# Patient Record
Sex: Female | Born: 1970 | Race: White | Hispanic: No | Marital: Married | Smoking: Never smoker
Health system: Southern US, Community
[De-identification: ages and names within clinical notes are randomized; demographics above are authoritative.]

## PROBLEM LIST (undated history)

## (undated) DIAGNOSIS — I8390 Asymptomatic varicose veins of unspecified lower extremity: Secondary | ICD-10-CM

## (undated) DIAGNOSIS — I7300 Raynaud's syndrome without gangrene: Secondary | ICD-10-CM

## (undated) HISTORY — DX: Raynaud's syndrome without gangrene: I73.00

## (undated) HISTORY — DX: Asymptomatic varicose veins of unspecified lower extremity: I83.90

---

## 1994-02-03 HISTORY — PX: PATELLAR TENDON REPAIR: SHX737

## 1994-02-03 HISTORY — PX: BREAST LUMPECTOMY: SHX2

## 1994-02-03 HISTORY — PX: BREAST EXCISIONAL BIOPSY: SUR124

## 1997-03-21 ENCOUNTER — Other Ambulatory Visit: Admission: RE | Admit: 1997-03-21 | Discharge: 1997-03-21 | Payer: Self-pay | Admitting: Unknown Physician Specialty

## 1997-06-12 ENCOUNTER — Other Ambulatory Visit: Admission: RE | Admit: 1997-06-12 | Discharge: 1997-06-12 | Payer: Self-pay | Admitting: Obstetrics and Gynecology

## 1998-01-16 ENCOUNTER — Other Ambulatory Visit: Admission: RE | Admit: 1998-01-16 | Discharge: 1998-01-16 | Payer: Self-pay | Admitting: Obstetrics and Gynecology

## 1998-06-28 ENCOUNTER — Other Ambulatory Visit: Admission: RE | Admit: 1998-06-28 | Discharge: 1998-06-28 | Payer: Self-pay | Admitting: Obstetrics and Gynecology

## 1998-12-06 ENCOUNTER — Other Ambulatory Visit: Admission: RE | Admit: 1998-12-06 | Discharge: 1998-12-06 | Payer: Self-pay | Admitting: Obstetrics and Gynecology

## 1999-07-10 ENCOUNTER — Other Ambulatory Visit: Admission: RE | Admit: 1999-07-10 | Discharge: 1999-07-10 | Payer: Self-pay | Admitting: Obstetrics and Gynecology

## 2000-07-23 ENCOUNTER — Inpatient Hospital Stay (HOSPITAL_COMMUNITY): Admission: AD | Admit: 2000-07-23 | Discharge: 2000-07-25 | Payer: Self-pay | Admitting: Obstetrics & Gynecology

## 2000-07-28 ENCOUNTER — Encounter: Admission: RE | Admit: 2000-07-28 | Discharge: 2000-08-27 | Payer: Self-pay | Admitting: Obstetrics and Gynecology

## 2000-09-07 ENCOUNTER — Other Ambulatory Visit: Admission: RE | Admit: 2000-09-07 | Discharge: 2000-09-07 | Payer: Self-pay | Admitting: Obstetrics and Gynecology

## 2001-10-07 ENCOUNTER — Other Ambulatory Visit: Admission: RE | Admit: 2001-10-07 | Discharge: 2001-10-07 | Payer: Self-pay | Admitting: Obstetrics and Gynecology

## 2002-07-26 ENCOUNTER — Other Ambulatory Visit: Admission: RE | Admit: 2002-07-26 | Discharge: 2002-07-26 | Payer: Self-pay | Admitting: Obstetrics and Gynecology

## 2002-12-17 ENCOUNTER — Inpatient Hospital Stay (HOSPITAL_COMMUNITY): Admission: AD | Admit: 2002-12-17 | Discharge: 2002-12-17 | Payer: Self-pay | Admitting: Obstetrics & Gynecology

## 2003-02-19 ENCOUNTER — Inpatient Hospital Stay (HOSPITAL_COMMUNITY): Admission: AD | Admit: 2003-02-19 | Discharge: 2003-02-21 | Payer: Self-pay | Admitting: *Deleted

## 2003-04-03 ENCOUNTER — Other Ambulatory Visit: Admission: RE | Admit: 2003-04-03 | Discharge: 2003-04-03 | Payer: Self-pay | Admitting: Obstetrics and Gynecology

## 2004-05-08 ENCOUNTER — Other Ambulatory Visit: Admission: RE | Admit: 2004-05-08 | Discharge: 2004-05-08 | Payer: Self-pay | Admitting: Obstetrics and Gynecology

## 2004-05-10 ENCOUNTER — Encounter: Admission: RE | Admit: 2004-05-10 | Discharge: 2004-05-10 | Payer: Self-pay | Admitting: Obstetrics and Gynecology

## 2006-07-10 ENCOUNTER — Ambulatory Visit (HOSPITAL_COMMUNITY): Admission: RE | Admit: 2006-07-10 | Discharge: 2006-07-10 | Payer: Self-pay | Admitting: Obstetrics and Gynecology

## 2006-10-06 ENCOUNTER — Ambulatory Visit: Payer: Self-pay | Admitting: Obstetrics and Gynecology

## 2006-10-13 ENCOUNTER — Ambulatory Visit: Payer: Self-pay | Admitting: Obstetrics & Gynecology

## 2006-10-20 ENCOUNTER — Ambulatory Visit: Payer: Self-pay | Admitting: Gynecology

## 2006-10-28 ENCOUNTER — Ambulatory Visit: Payer: Self-pay | Admitting: Obstetrics & Gynecology

## 2006-11-03 ENCOUNTER — Ambulatory Visit: Payer: Self-pay | Admitting: Family Medicine

## 2006-11-10 ENCOUNTER — Ambulatory Visit: Payer: Self-pay | Admitting: Obstetrics & Gynecology

## 2006-11-17 ENCOUNTER — Ambulatory Visit: Payer: Self-pay | Admitting: Obstetrics & Gynecology

## 2006-11-22 ENCOUNTER — Encounter: Admission: RE | Admit: 2006-11-22 | Discharge: 2006-12-22 | Payer: Self-pay | Admitting: Obstetrics and Gynecology

## 2006-11-23 ENCOUNTER — Ambulatory Visit: Admission: RE | Admit: 2006-11-23 | Discharge: 2006-11-23 | Payer: Self-pay | Admitting: Obstetrics and Gynecology

## 2006-11-26 ENCOUNTER — Ambulatory Visit: Admission: RE | Admit: 2006-11-26 | Discharge: 2006-11-26 | Payer: Self-pay | Admitting: Obstetrics and Gynecology

## 2006-12-23 ENCOUNTER — Encounter: Admission: RE | Admit: 2006-12-23 | Discharge: 2007-01-21 | Payer: Self-pay | Admitting: Obstetrics and Gynecology

## 2007-01-22 ENCOUNTER — Encounter: Admission: RE | Admit: 2007-01-22 | Discharge: 2007-02-21 | Payer: Self-pay | Admitting: Obstetrics and Gynecology

## 2007-02-22 ENCOUNTER — Encounter: Admission: RE | Admit: 2007-02-22 | Discharge: 2007-03-24 | Payer: Self-pay | Admitting: Obstetrics and Gynecology

## 2007-03-25 ENCOUNTER — Encounter: Admission: RE | Admit: 2007-03-25 | Discharge: 2007-04-21 | Payer: Self-pay | Admitting: Obstetrics and Gynecology

## 2007-04-22 ENCOUNTER — Encounter: Admission: RE | Admit: 2007-04-22 | Discharge: 2007-05-22 | Payer: Self-pay | Admitting: Obstetrics and Gynecology

## 2007-05-23 ENCOUNTER — Encounter: Admission: RE | Admit: 2007-05-23 | Discharge: 2007-06-21 | Payer: Self-pay | Admitting: Obstetrics and Gynecology

## 2007-06-22 ENCOUNTER — Encounter: Admission: RE | Admit: 2007-06-22 | Discharge: 2007-07-22 | Payer: Self-pay | Admitting: Obstetrics and Gynecology

## 2007-10-01 ENCOUNTER — Ambulatory Visit: Payer: Self-pay | Admitting: Vascular Surgery

## 2008-01-07 ENCOUNTER — Ambulatory Visit: Payer: Self-pay | Admitting: Vascular Surgery

## 2008-03-29 ENCOUNTER — Ambulatory Visit: Payer: Self-pay | Admitting: Vascular Surgery

## 2008-03-29 HISTORY — PX: ENDOVENOUS ABLATION SAPHENOUS VEIN W/ LASER: SUR449

## 2008-04-05 ENCOUNTER — Ambulatory Visit: Payer: Self-pay | Admitting: Vascular Surgery

## 2008-06-16 ENCOUNTER — Ambulatory Visit: Payer: Self-pay | Admitting: Vascular Surgery

## 2008-10-16 ENCOUNTER — Encounter: Admission: RE | Admit: 2008-10-16 | Discharge: 2008-10-16 | Payer: Self-pay | Admitting: Internal Medicine

## 2009-11-01 ENCOUNTER — Encounter: Admission: RE | Admit: 2009-11-01 | Discharge: 2009-11-01 | Payer: Self-pay | Admitting: Obstetrics and Gynecology

## 2009-11-08 ENCOUNTER — Encounter: Admission: RE | Admit: 2009-11-08 | Discharge: 2009-11-08 | Payer: Self-pay | Admitting: Obstetrics and Gynecology

## 2010-06-18 NOTE — Assessment & Plan Note (Signed)
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  She looks quite good 1 week out.  She has no  significant discomfort in her calf.  She does report a typical amount of  discomfort and soreness in her medial thigh.  She does have some mild  typical bruising as well.   She underwent repeat venous duplex today, and this reveals complete  closure of her saphenous vein from her knee to her groin and no evidence  of DVT.   I am quite pleased with her initial result, as is the patient.  Plan to  see her again in 6 weeks for final followup.   Larina Earthly, M.D.  Electronically Signed   TFE/MEDQ  D:  04/05/2008  T:  04/06/2008  Job:  [id number]   cc:   Amy Y. [place], M.D.

## 2010-06-18 NOTE — Procedures (Signed)
LOWER EXTREMITY VENOUS REFLUX EXAM   INDICATION:  Right lower extremity painful varicosities.   EXAM:  Using color-flow imaging and pulse Doppler spectral analysis, the  right common femoral, superficial femoral, popliteal, posterior tibial,  greater and lesser saphenous veins are evaluated.  There is evidence  suggesting deep venous insufficiency in the right lower extremity common  femoral vein.   The right saphenofemoral junction is not competent.  The right GSV is  not competent with the caliber as described below.   The right proximal short saphenous vein demonstrates competency and is  barely visualized.   GSV Diameter (used if found to be incompetent only)                                            Right    Left  Proximal Greater Saphenous Vein           0.95 cm  cm  Proximal-to-mid-thigh                     1.44 cm  cm  Mid thigh                                 0.68 cm  cm  Mid-distal thigh                          1.35 cm  cm  Distal thigh                              0.83 cm  cm  Knee                                      0.63 cm  cm   IMPRESSION:  1. Right greater saphenous vein reflux is identified with the caliber      ranging from 0.63 cm to 1.44 cm knee to groin.  2. The right greater saphenous vein is not aneurysmal.  3. The right greater saphenous vein is not tortuous.  4. The deep venous system is not competent in the right common femoral      vein.  5. The right lesser saphenous vein is competent; however, barely      visualized.   ___________________________________________  Larina Earthly, M.D.   AS/MEDQ  D:  01/07/2008  T:  01/07/2008  Job:  [id number]

## 2010-06-18 NOTE — Procedures (Signed)
DUPLEX DEEP VENOUS EXAM - LOWER EXTREMITY   INDICATION:  Follow up right greater saphenous vein ablation.   HISTORY:  Edema:  Minimal, at top of thigh.  Trauma/Surgery:  Right greater saphenous vein ablation with stab  phlebectomy on 03/29/08 by Dr. Arbie Cookey.  Pain:  No.  PE:  No.  Previous DVT:  No.  Anticoagulants:  No.  Other:   DUPLEX EXAM:                CFV   SFV   PopV   PTV   GSV                R  L  R  L  R  L   R  L  R  L  Thrombosis    o  o  o     o      o     +  Spontaneous   +  +  +     +      +     0  Phasic        +  +  +     +      +     0  Augmentation  +  +  +     +      +     0  Compressible  +  +  +     +      +     0  Competent     D  D  +     +      +     0   Legend:  + - yes  o - no  p - partial  D - decreased   IMPRESSION:  1. No evidence of deep venous thrombosis in the right lower extremity      or the left common femoral vein.  2. Evidence of ablation of the right greater saphenous vein with no      flow noted.  3. Evidence of dilatated segments of greater saphenous vein in the      proximal and distal thigh, consistent with previous study.  4. Bilateral common femoral veins show evidence of mild insufficiency.  5. Right saphenofemoral junction shows evidence of minimal      insufficiency flow into the greater saphenous vein lateral      accessory branch.    _____________________________  Larina Earthly, M.D.   AS/MEDQ  D:  04/05/2008  T:  04/05/2008  Job:  [id number]

## 2010-06-18 NOTE — Assessment & Plan Note (Signed)
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  She has  worn compression garments for 3 months since my last visit with her,  thigh-high 20-30 mmHg.  She reports that his has given her no relief in  her pain.  She has difficulty with childcare, mother of 4 young  children, and housework due to leg pain and swelling.  She also has had  to decrease the frequency and duration of types of exercise.  She  stopped aerobics and high impact exercises due to pain and swelling in  her legs.  Also has great difficulty with prolonged sitting, for  example, sewing, due to leg pain and swelling.   PHYSICAL EXAM:  She continues to have marked varicosities throughout her  right calf.   She underwent a formal venous duplex today and this confirmed my initial  impression with her hand-held screening duplex, that shows marked reflux  throughout her great saphenous vein from the saphenofemoral junction  down into the calf.  I discussed the significance of this with the  patient.  I recommended that we proceed with laser ablation of her right  greater saphenous vein and concomitant stab phlebectomy of her multiple  tributary varicosities.  I explained the procedure is an outpatient  procedure that would take approximately 1-1/2 hours in our office.  I  explained the potential complications to include deep vein thrombosis,  very unusual complication, and also potential for non-closure of her  saphenous vein.  She understands and wishes to proceed as soon as we can  assure insurance coverage.   Larina Earthly, M.D.  Electronically Signed   TFE/MEDQ  D:  01/07/2008  T:  01/10/2008  Job:  [id number]   cc:   Amy Y. [place], M.D.

## 2010-06-18 NOTE — Assessment & Plan Note (Signed)
OFFICE VISIT   [name], [name]  DOB:  04-29-1970                                       06/16/2008  [id number]#:[id number]   Patient presents today for followup of her laser ablation of her reverse  greater saphenous vein and stab phlebectomy of her tributary  varicosities on 03/29/08.  She has done well since the procedure.  She  does report that the discomfort related to the varicosities has  completely resolved.  She still reports some sensation of heat with  prolonged standing in her right calf.   PHYSICAL EXAMINATION:  The marked varicosities have been treated with  stab phlebectomy, and the incisions are all healing quite nicely.  She  does have no significant swelling.  She has multiple bilateral spider  vein telangiectasia on both thighs and calves.   She underwent hand-held imaging of her saphenous vein on the right which  shows closure of her saphenous vein throughout its course.  She does  have patency of the short saphenous vein with some tributary varices in  the posterior calf coming off of the small saphenous.  She is not having  any symptoms related to this, and I have recommended continued  observation.  She was interested in potentially pursuing sclerotherapy  for cosmetic concerns in the fall and will notify us should she wish to  proceed.   Larina Earthly, M.D.  Electronically Signed   TFE/MEDQ  D:  06/16/2008  T:  06/19/2008  Job:  [id number]

## 2010-06-18 NOTE — Consult Note (Signed)
NEW PATIENT CONSULTATION   [name], [name]  DOB:  08-Jul-1970                                       10/01/2007  ZOXWR#:[id number]   The patient presents today for evaluation of progressive venous  hypertension in her right leg.  She is a very pleasant healthy 36-year  white female who has had progressive difficulty with the right leg  varicose veins.  These began during the initial pregnancies and have  become more severe with each additional pregnancy.  On her most recent  pregnancy, she did wear graduated compression pantyhose throughout her  pregnancy and continues to wear pantyhose style graduated compression  stockings.  Despite this, she has had progressive pain, and discomfort  related.  She reports a tired heavy sensation over the right leg in  particular over the area of the varicosities.  She has pain in her legs  with exercise with a throbbing and cramping discomfort and burning pain  with skin discoloration over the areas.  She does report swelling in her  calves and ankles.  She does have a family history of the varicosities  in her mother.   PAST MEDICAL HISTORY:  Significant only for migraine headaches and  asthma.   MEDICAL ALLERGIES:  Sulfa.   She does not use tobacco products and does not have any history of deep  venous thrombosis.   PAST SURGICAL HISTORY:  Knee surgery and lumpectomy of her left breast.   PHYSICAL EXAMINATION:  Well-developed, well-nourished white female  appearing stated age of 72.  Her blood 105/70, pulse 75, respirations  18.  Her left leg has scattered telangiectasias and no varicosities.  On  her right leg, she does have very large marked varicosities in her right  posterior calf.  She does have marked telangiectasia over her right leg  from her knees distally.   She underwent a screening duplex by me and this reveals an enlarged  great saphenous vein on the right with gross reflux.  Saphenous vein on  the left  appears to be normal.  I discussed options with the patient.  She has clearly failed conservative treatment with elevation and  complaint use of pantyhose style graduated compression garments.  I have  recommended right leg laser ablation of her great saphenous vein and  stab phlebectomy for relief of symptoms.  She will undergo formal duplex  to confirm that she does not have any significant of deep venous  pathology.  We will schedule her procedure following her duplex  evaluation.   Deborah [name], M.D.  Electronically Signed   TFE/MEDQ  D:  10/01/2007  T:  10/04/2007  Job:  [id number]   cc:   Amy Y. [place], M.D.

## 2010-11-14 ENCOUNTER — Other Ambulatory Visit: Payer: Self-pay | Admitting: Obstetrics and Gynecology

## 2010-11-14 DIAGNOSIS — Z1231 Encounter for screening mammogram for malignant neoplasm of breast: Secondary | ICD-10-CM

## 2010-12-04 ENCOUNTER — Ambulatory Visit
Admission: RE | Admit: 2010-12-04 | Discharge: 2010-12-04 | Disposition: A | Discharge status: Home / Self Care | Payer: BC Managed Care – PPO | Source: Ambulatory Visit | Attending: Obstetrics and Gynecology | Admitting: Obstetrics and Gynecology

## 2010-12-04 DIAGNOSIS — Z1231 Encounter for screening mammogram for malignant neoplasm of breast: Secondary | ICD-10-CM

## 2010-12-11 ENCOUNTER — Other Ambulatory Visit: Payer: Self-pay | Admitting: Obstetrics and Gynecology

## 2010-12-11 DIAGNOSIS — R928 Other abnormal and inconclusive findings on diagnostic imaging of breast: Secondary | ICD-10-CM

## 2010-12-18 ENCOUNTER — Ambulatory Visit
Admission: RE | Admit: 2010-12-18 | Discharge: 2010-12-18 | Disposition: A | Discharge status: Home / Self Care | Payer: BC Managed Care – PPO | Source: Ambulatory Visit | Attending: Obstetrics and Gynecology | Admitting: Obstetrics and Gynecology

## 2010-12-18 DIAGNOSIS — R928 Other abnormal and inconclusive findings on diagnostic imaging of breast: Secondary | ICD-10-CM

## 2010-12-30 ENCOUNTER — Other Ambulatory Visit: Payer: BC Managed Care – PPO

## 2011-08-18 ENCOUNTER — Encounter: Payer: Self-pay | Admitting: Vascular Surgery

## 2011-08-19 ENCOUNTER — Ambulatory Visit (INDEPENDENT_AMBULATORY_CARE_PROVIDER_SITE_OTHER): Payer: BC Managed Care – PPO | Admitting: Vascular Surgery

## 2011-08-19 ENCOUNTER — Encounter: Payer: Self-pay | Admitting: Vascular Surgery

## 2011-08-19 VITALS — BP 108/73 | HR 79 | Resp 18 | Ht 68.0 in | Wt 153.0 lb

## 2011-08-19 DIAGNOSIS — I83893 Varicose veins of bilateral lower extremities with other complications: Secondary | ICD-10-CM

## 2011-08-19 DIAGNOSIS — M79609 Pain in unspecified limb: Secondary | ICD-10-CM

## 2011-08-19 NOTE — Progress Notes (Signed)
The patient presents today for evaluation of the right leg venous hypertension. She is status post ablation of her great saphenous vein on the right in 2010 and multiple stab phlebectomy use as well. She is having recurrent difficulty with tributary varicosities in her right leg. These are most common and over her anterior thighs and extending around her calf. She does have some varicosities in her left anterior thigh extending laterally to her lateral calf and these are not causing any discomfort. Her right leg has had recurrent pain. She reports a throbbing sensation at the end of the day. He has not had any bleeding and has not had any DVT.  Past Medical History  Diagnosis Date  . Varicose veins     History  Substance Use Topics  . Smoking status: Never Smoker   . Smokeless tobacco: Not on file  . Alcohol Use: No    History reviewed. No pertinent family history.  No Known Allergies  Current outpatient prescriptions:Cholecalciferol (VITAMIN D) 2000 UNITS tablet, Take 2,000 Units by mouth daily., Disp: , Rfl: ;  Multiple Vitamin (MULTIVITAMIN) tablet, Take 1 tablet by mouth daily., Disp: , Rfl:   BP 108/73  Pulse 79  Resp 18  Ht 5\' 8"  (1.727 m)  Wt 153 lb (69.4 kg)  BMI 23.26 kg/m2  Body mass index is 23.26 kg/(m^2).       Physical exam: Well-developed well-nourished white female appearing stated age. 2 posterior cells pedis pulses bilaterally Right leg is noted for a larger tear varicosities beginning near the saphenofemoral junction and extending laterally over the anterior thigh and then straight down anterior to the level of her calf. On her left leg she has a smaller varicosity extending from the saphenofemoral junction lateral over her thighs to her lateral calf.  Venous duplex reveals no evidence of DVT she does have some repolarization in the great saphenous vein in the mid thigh. Remainder of the vein continues to remain ablated.  I imaged the area of her  symptomatic varicosities in her anterior thigh on the right with sinusitis does begin near the thrombosed saphenous vein at the saphenofemoral junction but does not communicate.  Impression and plan recurrent tributary varicosities. I discussed this at length the patient explaining that she has a pattern of disease graft suspect that she will have recurrent tributary symptomatic varicosities lifelong. She does have marked since of the tributaries on both legs more particularly on her right. These have become symptomatic again. She will begin wearing her thigh graduated compression and we will see her in 3 months. Suspect that she will require stab phlebectomy of these varicosities for symptom relief

## 2011-08-19 NOTE — Addendum Note (Signed)
Addended by: Margo Aye A on: 08/19/2011 04:09 PM   Modules accepted: Orders

## 2011-08-29 NOTE — Procedures (Unsigned)
DUPLEX DEEP VENOUS EXAM - LOWER EXTREMITY  INDICATION:  Right lower extremity pain and varicose veins.  HISTORY:  Edema:  No Trauma/Surgery:  Right endovenous laser ablation 03/29/2008 Pain:  Yes PE:  No Previous DVT:  No Anticoagulants:  No Other:  DUPLEX EXAM:               CFV   SFV   PopV  PTV    GSV               R  L  R  L  R  L  R   L  R  L Thrombosis    o     o     o     o      + Spontaneous   +     +     +     +      o Phasic        +     +     +     +      o Augmentation  +     +     +     +      D Compressible  +     +     +     +      P Competent     o     +     +     +      +  Legend:  + - yes  o - no  p - partial  D - decreased  IMPRESSION: 1. No evidence of deep venous thrombosis identified involving the     right lower extremity. 2. Partial reopening of the previously ablated right great saphenous     vein proximal mid thigh segment with competent perforator noted at     the mid segment. 3. The remainder of the right great saphenous vein previously ablated     remains closed.      _____________________________ Larina Earthly, M.D.  SH/MEDQ  D:  08/19/2011  T:  08/19/2011  Job:  [id number]

## 2011-11-17 ENCOUNTER — Encounter: Payer: Self-pay | Admitting: Vascular Surgery

## 2011-11-18 ENCOUNTER — Ambulatory Visit (INDEPENDENT_AMBULATORY_CARE_PROVIDER_SITE_OTHER): Payer: BC Managed Care – PPO | Admitting: Vascular Surgery

## 2011-11-18 ENCOUNTER — Encounter: Payer: Self-pay | Admitting: Vascular Surgery

## 2011-11-18 VITALS — BP 125/75 | HR 87 | Resp 18 | Ht 68.0 in | Wt 156.5 lb

## 2011-11-18 DIAGNOSIS — I83893 Varicose veins of bilateral lower extremities with other complications: Secondary | ICD-10-CM

## 2011-11-18 NOTE — Progress Notes (Signed)
Problems with Activities of Daily Living Secondary to Leg Pain  1. Deborah [name] states that exercise is very difficult due to leg pain.  2. Deborah [name] states that any activity requiring prolonged standing (cooking, cleaning, shopping) is difficult due to leg pain.     Failure of  Conservative Therapy:  1. Worn 20-30 mm Hg thigh high compression hose >3 months with no relief of symptoms.  2. Frequently elevates legs-no relief of symptoms  3. Taken Ibuprofen 600 Mg TID with no relief of symptoms.  The patient presents today for continued followup of her right leg pain from venous hypertension. She continues to have discomfort with prolonged standing despite use of compression garments and elevation.She is status post ablation of her right great saphenous vein. I reimaged these areas with the SonoSite ultrasound today. This does confirm closure of her great saphenous vein. She does have tributary branches arising near the saphenofemoral junction and a fairly non typical pattern of veins across her anterior thigh extending down onto her calf. These are quite enlarged and are the area of her pain. I feel that she has failed conservative treatment. There is no communication with ease to her prior saphenous vein which is been closed. I have recommended stab phlebectomy for relief of her symptoms. She understands this would be under local anesthesia in our office.

## 2011-11-27 ENCOUNTER — Telehealth: Payer: Self-pay | Admitting: *Deleted

## 2011-11-27 NOTE — Telephone Encounter (Signed)
Deborah Began MD and Roxine Caddy (Medical Director [employer] [place]) had peer-to-peer conference call regarding denial of CPT [phone] (office surgery).  After discussion, Roxine Caddy MD upheld denial of CPT [phone].  Notified Deborah [name] of [employer] [place] denial of CPT [phone] despite peer-to-peer conference call.  Offered the option of performing the procedure with the patient paying for the procedure (self-pay).  Gave her an estimate of charges.  Deborah [name] will discuss with her husband and will let me know of their decision.

## 2011-12-02 ENCOUNTER — Other Ambulatory Visit: Payer: Self-pay | Admitting: Obstetrics and Gynecology

## 2011-12-02 DIAGNOSIS — Z1231 Encounter for screening mammogram for malignant neoplasm of breast: Secondary | ICD-10-CM

## 2012-01-14 ENCOUNTER — Ambulatory Visit
Admission: RE | Admit: 2012-01-14 | Discharge: 2012-01-14 | Disposition: A | Discharge status: Home / Self Care | Payer: BC Managed Care – PPO | Source: Ambulatory Visit | Attending: Obstetrics and Gynecology | Admitting: Obstetrics and Gynecology

## 2012-01-14 DIAGNOSIS — Z1231 Encounter for screening mammogram for malignant neoplasm of breast: Secondary | ICD-10-CM

## 2012-06-14 ENCOUNTER — Encounter: Payer: Self-pay | Admitting: Vascular Surgery

## 2012-06-15 ENCOUNTER — Encounter: Payer: Self-pay | Admitting: Vascular Surgery

## 2012-06-15 ENCOUNTER — Other Ambulatory Visit: Payer: Self-pay | Admitting: *Deleted

## 2012-06-15 ENCOUNTER — Encounter (INDEPENDENT_AMBULATORY_CARE_PROVIDER_SITE_OTHER): Payer: BC Managed Care – PPO | Admitting: *Deleted

## 2012-06-15 ENCOUNTER — Ambulatory Visit (INDEPENDENT_AMBULATORY_CARE_PROVIDER_SITE_OTHER): Payer: BC Managed Care – PPO | Admitting: Vascular Surgery

## 2012-06-15 VITALS — BP 119/68 | HR 83 | Resp 18 | Ht 68.0 in | Wt 153.5 lb

## 2012-06-15 DIAGNOSIS — M7989 Other specified soft tissue disorders: Secondary | ICD-10-CM

## 2012-06-15 DIAGNOSIS — M79609 Pain in unspecified limb: Secondary | ICD-10-CM

## 2012-06-15 DIAGNOSIS — I83893 Varicose veins of bilateral lower extremities with other complications: Secondary | ICD-10-CM

## 2012-06-15 NOTE — Progress Notes (Signed)
The patient presents today for continued discussion of her lower extremity venous pathology. She is a very pleasant 42 year old female who is status post laser ablation of her right great saphenous vein from just below her knee to the saphenofemoral junction and stab phlebectomy of multiple large tear varicosities in the calf in 2010. She is very active mother of 4 children who stands great deal of time. Despite the continued use of compression garment she has severe discomfort in both legs. This is much worse in her right leg than her left. She reports that the left leg is tolerable. He has a multiple components of this. She does have multiple tributary varicosities over her anterior thigh on the right. She has discomfort specifically over these with tenderness to touch particularly after standing for a great period of time. She also has some recurrent tributary varicosities in her right posterior calf and also has discomfort over these. She has a generalized achy sensation as well. This is to some degree present in the left anterior thigh but not as severe as the right side. She has no history of DVT. She has no other major medical problems.  Past Medical History  Diagnosis Date  . Varicose veins   . Raynaud's disease     bilateral feet and hands    History  Substance Use Topics  . Smoking status: Never Smoker   . Smokeless tobacco: Not on file  . Alcohol Use: Yes     Comment: social    History reviewed. No pertinent family history.  Allergies  Allergen Reactions  . Sulfa Antibiotics     Current outpatient prescriptions:Cholecalciferol (VITAMIN D) 2000 UNITS tablet, Take 2,000 Units by mouth daily., Disp: , Rfl: ;  Multiple Vitamin (MULTIVITAMIN) tablet, Take 1 tablet by mouth daily., Disp: , Rfl:   BP 119/68  Pulse 83  Resp 18  Ht 5\' 8"  (1.727 m)  Wt 153 lb 8 oz (69.627 kg)  BMI 23.34 kg/m2  Body mass index is 23.34 kg/(m^2).       Physical exam: Well-developed  well-nourished white female in no acute distress 2+ dorsalis pedis pulses bilaterally No evidence of venous ulceration Neurologically she is grossly intact She does have large tributary varicosity plexus over her anterior thigh and this extends somewhat around to the lateral thigh. She also has tributary varicosities of her right posterior calf. She does have a pattern of plexus of reticular veins over the left anterior thigh and these are smaller than the right.  Repeat venous duplex today reveals closure of her great saphenous vein throughout the right leg from proximal calf to just below the saphenofemoral junction. There are a large plexus of neovascularity veins at the saphenofemoral junction with no discernible enlarged great saphenous vein. There are multiple large tributary varicosities over her entire anterior thigh. These are to a lesser degree in her right posterior calf. Venous duplex on the left does show saphenous vein dilatation from the knee to the saphenofemoral junction. There is reflux in the saphenous vein.  Impression and plan pain related to tributary varicosities over the anterior right thigh. Also discomfort in the left anterior thigh to a lesser degree. I again explained treatment options. She does not have any evidence of saphenous vein recanalization or reflux on the right to be treated. I do feel that we would get symptomatic relief with stab phlebectomy of the large tributary varicosities which are causing the majority of her discomfort. This was denied on insurance grounds approximately one year  ago. We will resubmit this due to her continued persistent and increasing pain despite optimal conservative treatment. I would not recommend laser ablation of her left great saphenous vein stenosis currently is not causing the majority of her discomfort.

## 2012-08-25 ENCOUNTER — Encounter: Payer: Self-pay | Admitting: Vascular Surgery

## 2012-08-26 ENCOUNTER — Encounter: Payer: Self-pay | Admitting: Vascular Surgery

## 2012-08-26 ENCOUNTER — Ambulatory Visit (INDEPENDENT_AMBULATORY_CARE_PROVIDER_SITE_OTHER): Payer: BC Managed Care – PPO | Admitting: Vascular Surgery

## 2012-08-26 VITALS — BP 100/70 | HR 86 | Resp 18 | Ht 68.0 in | Wt 153.0 lb

## 2012-08-26 DIAGNOSIS — I83893 Varicose veins of bilateral lower extremities with other complications: Secondary | ICD-10-CM

## 2012-08-26 HISTORY — PX: OTHER SURGICAL HISTORY: SHX169

## 2012-08-26 NOTE — Progress Notes (Signed)
Stab Phlebectomy Procedure     Date: 08/26/2012    Deborah [name] DOB:[birth year]  Consent signed: Yes  Surgeon:T.F. Alexy Bringle    BP 100/70  Pulse 86  Resp 18  Ht 5\' 8"  (1.727 m)  Wt 153 lb (69.4 kg)  BMI 23.27 kg/m2  Start time: 9:00AM   End time: 10:20AM  Tumescent Anesthesia: 475 cc 0.9% NaCl with 50 cc Lidocaine HCL with 1% Epi and 15 cc 8.4% NaHCO3  Local Anesthesia: 4 cc Lidocaine HCL and NaHCO3 (ratio 2:1)    Stab Phlebectomy: 10-20 incisions Sites: Thigh and Calf right leg  Patient tolerated procedure well: Yes   Description of Procedure:  After marking the course of the saphenous vein and the secondary varicosities in the standing position, the patient was placed on the operating table in the supine position, and the right leg was prepped and draped in sterile fashion. Local anesthetic was administered. The patient was then put into Trendelenburg position.  Local anesthetic was utilized overlying the marked varicosities.  Greater than 10-20 stab wounds were made using the tip of an 11 blade; and using the vein hook,  The phlebectomies were performed using a hemostat to avulse these varicosities.  Adequate hemostasis was achieved, and steri strips were applied to the stab wound.      ABD pads and thigh high compression stockings were applied.  Ace wrap bandages were applied over the phlebectomy sites.  Blood loss was less than 15 cc.  The patient ambulated out of the operating room having tolerated the procedure well.

## 2012-08-31 ENCOUNTER — Telehealth: Payer: Self-pay | Admitting: *Deleted

## 2012-08-31 NOTE — Telephone Encounter (Signed)
08/31/2012  Time: 9:23 AM   Patient Name: Deborah [name]  Patient of: T.F. Early  Procedure: 08-26-2012 stab phlebectomy 10-20 incisions right leg     Reached patient at home and checked  Her status  Yes    Comments/Actions Taken: [name] states she has an area of tenderness in the right upper thigh (stab site) near where the silicon beading of compression hose touches her skin- states compression hose are rolling down. Advised her to take Ace wrap off and remove pads on top of compression hose, pull compression hose up and re-wrap ace wrap snugly. Reviewed post procedural instructions with [name] and reminded her of follow up appointment with Dr. Arbie Cookey on 09-09-2012.    @SIGNATURE @

## 2012-09-09 ENCOUNTER — Telehealth: Payer: Self-pay | Admitting: *Deleted

## 2012-09-09 ENCOUNTER — Ambulatory Visit: Payer: BC Managed Care – PPO | Admitting: Vascular Surgery

## 2012-09-09 NOTE — Telephone Encounter (Signed)
Informed Deborah [name] that Deborah [name] engaged in peer-to-peer review phone call with [facility] Director Deborah Meigs MD attempting to overturn denial of CPT [phone].   Deborah [name] was unable to overturn denial of CPT [phone].  Reference # [id number].  Deborah [name] was disappointed but appreciates Dr. Bosie Helper efforts to overturn the denial.  Deborah [name] will discuss this with her husband and let me know if she wants to schedule the procedure and pay out-of-pocket.

## 2013-02-15 ENCOUNTER — Other Ambulatory Visit: Payer: Self-pay

## 2013-02-15 DIAGNOSIS — Z1231 Encounter for screening mammogram for malignant neoplasm of breast: Secondary | ICD-10-CM

## 2013-03-08 ENCOUNTER — Ambulatory Visit
Admission: RE | Admit: 2013-03-08 | Discharge: 2013-03-08 | Disposition: A | Discharge status: Home / Self Care | Payer: BC Managed Care – PPO | Source: Ambulatory Visit

## 2013-03-08 DIAGNOSIS — Z1231 Encounter for screening mammogram for malignant neoplasm of breast: Secondary | ICD-10-CM

## 2014-02-07 ENCOUNTER — Other Ambulatory Visit: Payer: Self-pay

## 2014-02-07 DIAGNOSIS — Z1231 Encounter for screening mammogram for malignant neoplasm of breast: Secondary | ICD-10-CM

## 2014-03-10 ENCOUNTER — Ambulatory Visit: Payer: Self-pay

## 2014-03-15 ENCOUNTER — Encounter (INDEPENDENT_AMBULATORY_CARE_PROVIDER_SITE_OTHER): Payer: Self-pay

## 2014-03-15 ENCOUNTER — Ambulatory Visit
Admission: RE | Admit: 2014-03-15 | Discharge: 2014-03-15 | Disposition: A | Discharge status: Home / Self Care | Payer: BLUE CROSS/BLUE SHIELD | Source: Ambulatory Visit

## 2014-03-15 DIAGNOSIS — Z1231 Encounter for screening mammogram for malignant neoplasm of breast: Secondary | ICD-10-CM

## 2014-06-08 ENCOUNTER — Other Ambulatory Visit: Payer: Self-pay | Admitting: Obstetrics and Gynecology

## 2014-06-08 DIAGNOSIS — Z803 Family history of malignant neoplasm of breast: Secondary | ICD-10-CM

## 2014-06-22 ENCOUNTER — Ambulatory Visit
Admission: RE | Admit: 2014-06-22 | Discharge: 2014-06-22 | Disposition: A | Discharge status: Home / Self Care | Payer: BLUE CROSS/BLUE SHIELD | Source: Ambulatory Visit | Attending: Obstetrics and Gynecology | Admitting: Obstetrics and Gynecology

## 2014-06-22 DIAGNOSIS — Z803 Family history of malignant neoplasm of breast: Secondary | ICD-10-CM

## 2014-06-22 MED ORDER — GADOBENATE DIMEGLUMINE 529 MG/ML IV SOLN
14.0000 mL | Freq: Once | INTRAVENOUS | Status: AC | PRN
  Administered 2014-06-22: 14 mL via INTRAVENOUS

## 2015-02-21 ENCOUNTER — Other Ambulatory Visit: Payer: Self-pay

## 2015-02-21 DIAGNOSIS — Z1231 Encounter for screening mammogram for malignant neoplasm of breast: Secondary | ICD-10-CM

## 2015-03-27 ENCOUNTER — Ambulatory Visit
Admission: RE | Admit: 2015-03-27 | Discharge: 2015-03-27 | Disposition: A | Discharge status: Home / Self Care | Payer: BLUE CROSS/BLUE SHIELD | Source: Ambulatory Visit

## 2015-03-27 DIAGNOSIS — Z1231 Encounter for screening mammogram for malignant neoplasm of breast: Secondary | ICD-10-CM

## 2015-03-30 ENCOUNTER — Other Ambulatory Visit: Payer: Self-pay | Admitting: Obstetrics and Gynecology

## 2015-03-30 DIAGNOSIS — R928 Other abnormal and inconclusive findings on diagnostic imaging of breast: Secondary | ICD-10-CM

## 2015-04-06 ENCOUNTER — Other Ambulatory Visit: Payer: BLUE CROSS/BLUE SHIELD

## 2015-04-06 ENCOUNTER — Ambulatory Visit
Admission: RE | Admit: 2015-04-06 | Discharge: 2015-04-06 | Disposition: A | Discharge status: Home / Self Care | Payer: BLUE CROSS/BLUE SHIELD | Source: Ambulatory Visit | Attending: Obstetrics and Gynecology | Admitting: Obstetrics and Gynecology

## 2015-04-06 DIAGNOSIS — R928 Other abnormal and inconclusive findings on diagnostic imaging of breast: Secondary | ICD-10-CM

## 2015-06-06 DIAGNOSIS — Z01419 Encounter for gynecological examination (general) (routine) without abnormal findings: Secondary | ICD-10-CM | POA: Diagnosis not present

## 2015-06-06 DIAGNOSIS — Z6825 Body mass index (BMI) 25.0-25.9, adult: Secondary | ICD-10-CM | POA: Diagnosis not present

## 2015-08-17 DIAGNOSIS — H0014 Chalazion left upper eyelid: Secondary | ICD-10-CM | POA: Diagnosis not present

## 2015-09-07 DIAGNOSIS — H5213 Myopia, bilateral: Secondary | ICD-10-CM | POA: Diagnosis not present

## 2015-09-07 DIAGNOSIS — H01004 Unspecified blepharitis left upper eyelid: Secondary | ICD-10-CM | POA: Diagnosis not present

## 2015-09-07 DIAGNOSIS — H01001 Unspecified blepharitis right upper eyelid: Secondary | ICD-10-CM | POA: Diagnosis not present

## 2015-09-13 DIAGNOSIS — Z23 Encounter for immunization: Secondary | ICD-10-CM | POA: Diagnosis not present

## 2015-10-16 DIAGNOSIS — R8299 Other abnormal findings in urine: Secondary | ICD-10-CM | POA: Diagnosis not present

## 2015-10-16 DIAGNOSIS — Z0000 Encounter for general adult medical examination without abnormal findings: Secondary | ICD-10-CM | POA: Diagnosis not present

## 2015-10-23 DIAGNOSIS — Z0000 Encounter for general adult medical examination without abnormal findings: Secondary | ICD-10-CM | POA: Diagnosis not present

## 2015-10-23 DIAGNOSIS — Z23 Encounter for immunization: Secondary | ICD-10-CM | POA: Diagnosis not present

## 2015-10-23 DIAGNOSIS — G43909 Migraine, unspecified, not intractable, without status migrainosus: Secondary | ICD-10-CM | POA: Diagnosis not present

## 2015-10-23 DIAGNOSIS — Z803 Family history of malignant neoplasm of breast: Secondary | ICD-10-CM | POA: Diagnosis not present

## 2015-10-23 DIAGNOSIS — I7300 Raynaud's syndrome without gangrene: Secondary | ICD-10-CM | POA: Diagnosis not present

## 2015-10-23 DIAGNOSIS — I868 Varicose veins of other specified sites: Secondary | ICD-10-CM | POA: Diagnosis not present

## 2015-10-23 DIAGNOSIS — Z1389 Encounter for screening for other disorder: Secondary | ICD-10-CM | POA: Diagnosis not present

## 2015-10-26 DIAGNOSIS — B078 Other viral warts: Secondary | ICD-10-CM | POA: Diagnosis not present

## 2015-11-22 DIAGNOSIS — G43909 Migraine, unspecified, not intractable, without status migrainosus: Secondary | ICD-10-CM | POA: Diagnosis not present

## 2015-11-22 DIAGNOSIS — E559 Vitamin D deficiency, unspecified: Secondary | ICD-10-CM | POA: Diagnosis not present

## 2015-11-22 DIAGNOSIS — R5383 Other fatigue: Secondary | ICD-10-CM | POA: Diagnosis not present

## 2015-11-22 DIAGNOSIS — Z803 Family history of malignant neoplasm of breast: Secondary | ICD-10-CM | POA: Diagnosis not present

## 2015-11-22 DIAGNOSIS — M2550 Pain in unspecified joint: Secondary | ICD-10-CM | POA: Diagnosis not present

## 2015-11-27 DIAGNOSIS — B078 Other viral warts: Secondary | ICD-10-CM | POA: Diagnosis not present

## 2016-01-08 DIAGNOSIS — N390 Urinary tract infection, site not specified: Secondary | ICD-10-CM | POA: Diagnosis not present

## 2016-01-08 DIAGNOSIS — R5383 Other fatigue: Secondary | ICD-10-CM | POA: Diagnosis not present

## 2016-01-08 DIAGNOSIS — G43909 Migraine, unspecified, not intractable, without status migrainosus: Secondary | ICD-10-CM | POA: Diagnosis not present

## 2016-01-08 DIAGNOSIS — M2550 Pain in unspecified joint: Secondary | ICD-10-CM | POA: Diagnosis not present

## 2016-03-06 ENCOUNTER — Other Ambulatory Visit: Payer: Self-pay | Admitting: Obstetrics and Gynecology

## 2016-03-06 DIAGNOSIS — Z1231 Encounter for screening mammogram for malignant neoplasm of breast: Secondary | ICD-10-CM

## 2016-03-25 DIAGNOSIS — B078 Other viral warts: Secondary | ICD-10-CM | POA: Diagnosis not present

## 2016-03-25 DIAGNOSIS — D2262 Melanocytic nevi of left upper limb, including shoulder: Secondary | ICD-10-CM | POA: Diagnosis not present

## 2016-03-25 DIAGNOSIS — L723 Sebaceous cyst: Secondary | ICD-10-CM | POA: Diagnosis not present

## 2016-03-25 DIAGNOSIS — L573 Poikiloderma of Civatte: Secondary | ICD-10-CM | POA: Diagnosis not present

## 2016-04-08 ENCOUNTER — Ambulatory Visit
Admission: RE | Admit: 2016-04-08 | Discharge: 2016-04-08 | Disposition: A | Discharge status: Home / Self Care | Payer: BLUE CROSS/BLUE SHIELD | Source: Ambulatory Visit | Attending: Obstetrics and Gynecology | Admitting: Obstetrics and Gynecology

## 2016-04-08 DIAGNOSIS — Z1231 Encounter for screening mammogram for malignant neoplasm of breast: Secondary | ICD-10-CM

## 2016-04-11 DIAGNOSIS — E559 Vitamin D deficiency, unspecified: Secondary | ICD-10-CM | POA: Diagnosis not present

## 2016-04-11 DIAGNOSIS — E538 Deficiency of other specified B group vitamins: Secondary | ICD-10-CM | POA: Diagnosis not present

## 2016-04-11 DIAGNOSIS — R5383 Other fatigue: Secondary | ICD-10-CM | POA: Diagnosis not present

## 2016-04-22 DIAGNOSIS — M2550 Pain in unspecified joint: Secondary | ICD-10-CM | POA: Diagnosis not present

## 2016-04-22 DIAGNOSIS — Z803 Family history of malignant neoplasm of breast: Secondary | ICD-10-CM | POA: Diagnosis not present

## 2016-04-22 DIAGNOSIS — E559 Vitamin D deficiency, unspecified: Secondary | ICD-10-CM | POA: Diagnosis not present

## 2016-04-22 DIAGNOSIS — R5383 Other fatigue: Secondary | ICD-10-CM | POA: Diagnosis not present

## 2016-06-17 DIAGNOSIS — Z01419 Encounter for gynecological examination (general) (routine) without abnormal findings: Secondary | ICD-10-CM | POA: Diagnosis not present

## 2016-06-17 DIAGNOSIS — Z6825 Body mass index (BMI) 25.0-25.9, adult: Secondary | ICD-10-CM | POA: Diagnosis not present

## 2016-09-11 DIAGNOSIS — H0015 Chalazion left lower eyelid: Secondary | ICD-10-CM | POA: Diagnosis not present

## 2016-09-11 DIAGNOSIS — H524 Presbyopia: Secondary | ICD-10-CM | POA: Diagnosis not present

## 2016-09-11 DIAGNOSIS — H5213 Myopia, bilateral: Secondary | ICD-10-CM | POA: Diagnosis not present

## 2016-10-23 DIAGNOSIS — Z0000 Encounter for general adult medical examination without abnormal findings: Secondary | ICD-10-CM | POA: Diagnosis not present

## 2016-10-30 DIAGNOSIS — Z0000 Encounter for general adult medical examination without abnormal findings: Secondary | ICD-10-CM | POA: Diagnosis not present

## 2016-10-30 DIAGNOSIS — M7062 Trochanteric bursitis, left hip: Secondary | ICD-10-CM | POA: Diagnosis not present

## 2016-10-30 DIAGNOSIS — I868 Varicose veins of other specified sites: Secondary | ICD-10-CM | POA: Diagnosis not present

## 2016-10-30 DIAGNOSIS — Z803 Family history of malignant neoplasm of breast: Secondary | ICD-10-CM | POA: Diagnosis not present

## 2016-10-30 DIAGNOSIS — I7300 Raynaud's syndrome without gangrene: Secondary | ICD-10-CM | POA: Diagnosis not present

## 2016-10-30 DIAGNOSIS — Z1389 Encounter for screening for other disorder: Secondary | ICD-10-CM | POA: Diagnosis not present

## 2016-10-30 DIAGNOSIS — Z23 Encounter for immunization: Secondary | ICD-10-CM | POA: Diagnosis not present

## 2017-03-12 ENCOUNTER — Other Ambulatory Visit: Payer: Self-pay | Admitting: Obstetrics and Gynecology

## 2017-03-12 DIAGNOSIS — Z1231 Encounter for screening mammogram for malignant neoplasm of breast: Secondary | ICD-10-CM

## 2017-04-09 ENCOUNTER — Ambulatory Visit
Admission: RE | Admit: 2017-04-09 | Discharge: 2017-04-09 | Disposition: A | Discharge status: Home / Self Care | Payer: BLUE CROSS/BLUE SHIELD | Source: Ambulatory Visit | Attending: Obstetrics and Gynecology | Admitting: Obstetrics and Gynecology

## 2017-04-09 DIAGNOSIS — Z1231 Encounter for screening mammogram for malignant neoplasm of breast: Secondary | ICD-10-CM

## 2017-04-15 DIAGNOSIS — Q670 Congenital facial asymmetry: Secondary | ICD-10-CM | POA: Diagnosis not present

## 2017-04-15 DIAGNOSIS — G43909 Migraine, unspecified, not intractable, without status migrainosus: Secondary | ICD-10-CM | POA: Diagnosis not present

## 2017-04-28 DIAGNOSIS — F4323 Adjustment disorder with mixed anxiety and depressed mood: Secondary | ICD-10-CM | POA: Diagnosis not present

## 2017-05-19 DIAGNOSIS — D2261 Melanocytic nevi of right upper limb, including shoulder: Secondary | ICD-10-CM | POA: Diagnosis not present

## 2017-05-19 DIAGNOSIS — D2262 Melanocytic nevi of left upper limb, including shoulder: Secondary | ICD-10-CM | POA: Diagnosis not present

## 2017-05-19 DIAGNOSIS — D225 Melanocytic nevi of trunk: Secondary | ICD-10-CM | POA: Diagnosis not present

## 2017-05-19 DIAGNOSIS — L821 Other seborrheic keratosis: Secondary | ICD-10-CM | POA: Diagnosis not present

## 2017-05-20 ENCOUNTER — Encounter: Payer: Self-pay | Admitting: Family Medicine

## 2017-05-20 ENCOUNTER — Ambulatory Visit: Payer: Self-pay

## 2017-05-20 ENCOUNTER — Ambulatory Visit (INDEPENDENT_AMBULATORY_CARE_PROVIDER_SITE_OTHER): Payer: BLUE CROSS/BLUE SHIELD | Admitting: Family Medicine

## 2017-05-20 VITALS — BP 100/70 | HR 91 | Ht 68.0 in | Wt 153.0 lb

## 2017-05-20 DIAGNOSIS — G2589 Other specified extrapyramidal and movement disorders: Secondary | ICD-10-CM

## 2017-05-20 DIAGNOSIS — M999 Biomechanical lesion, unspecified: Secondary | ICD-10-CM | POA: Insufficient documentation

## 2017-05-20 DIAGNOSIS — M25512 Pain in left shoulder: Secondary | ICD-10-CM | POA: Diagnosis not present

## 2017-05-20 DIAGNOSIS — G8929 Other chronic pain: Secondary | ICD-10-CM

## 2017-05-20 DIAGNOSIS — M940 Chondrocostal junction syndrome [Tietze]: Secondary | ICD-10-CM | POA: Diagnosis not present

## 2017-05-20 DIAGNOSIS — S43432A Superior glenoid labrum lesion of left shoulder, initial encounter: Secondary | ICD-10-CM

## 2017-05-20 DIAGNOSIS — S43439A Superior glenoid labrum lesion of unspecified shoulder, initial encounter: Secondary | ICD-10-CM | POA: Insufficient documentation

## 2017-05-20 MED ORDER — VITAMIN D (ERGOCALCIFEROL) 1.25 MG (50000 UNIT) PO CAPS: 50000.0000 [IU] | ORAL_CAPSULE | ORAL | 0 refills | Status: DC

## 2017-05-20 MED ORDER — DICLOFENAC SODIUM 2 % TD SOLN: 2.0000 g | Freq: Two times a day (BID) | TRANSDERMAL | 3 refills | Status: AC

## 2017-05-20 NOTE — Progress Notes (Signed)
[name] [place], [place] [address] Phone: [phone] Subjective:     CC: Left shoulder pain  [id number]  [name] is a 47 y.o. female coming in with complaint of left shoulder pain. 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 Has had multiple diagnosis. Modifies her exercises. Limited ROM. States her shoulder fatigues very quickly (shoulder burns while she holds planks). Some numbness and tingling to the finger tips (when in child's pose). Any impingement the fingers tingle. Today she has neck pain on the left side. Normally no neck pain. Has done exercise and PT.   Onset- 25 years ago Location- anterior, posterior Character- Tight, sore Aggravating factors- Flexion Therapies tried- anti-inflammatories  Severity-8 out of 10 in severity     Past Medical History:  Diagnosis Date  . Raynaud's disease    bilateral feet and hands  . Varicose veins    Past Surgical History:  Procedure Laterality Date  . BREAST LUMPECTOMY  1996   left breast  . ENDOVENOUS ABLATION SAPHENOUS VEIN W/ LASER  03-29-2008   right greater saphenous vein, stab phlebectomy right leg, sclerotherapy   . PATELLAR TENDON REPAIR  1996   left knee  . stab phlebectomy Right 08-26-2012   10-20 incisions by Curt Jews MD   Social History   Socioeconomic History  . Marital status: Married    Spouse name: Not on file  . Number of children: Not on file  . Years of education: Not on file  . Highest education level: Not on file  Occupational History  . Not on file  Social Needs  . Financial resource strain: Not on file  . Food insecurity:    Worry: Not on file    Inability: Not on file  . Transportation needs:    Medical: Not on file    Non-medical: Not on file  Tobacco Use  . Smoking status: Never Smoker  . Smokeless tobacco: Never Used  Substance and Sexual Activity  . Alcohol use: Yes    Comment: social  . Drug use: No  . Sexual  activity: Not on file  Lifestyle  . Physical activity:    Days per week: Not on file    Minutes per session: Not on file  . Stress: Not on file  Relationships  . Social connections:    Talks on phone: Not on file    Gets together: Not on file    Attends religious service: Not on file    Active member of club or organization: Not on file    Attends meetings of clubs or organizations: Not on file    Relationship status: Not on file  Other Topics Concern  . Not on file  Social History Narrative  . Not on file   Allergies  Allergen Reactions  . Sulfa Antibiotics    Family History  Problem Relation Age of Onset  . Breast cancer Mother 72  . Breast cancer Maternal Aunt   . Breast cancer Maternal Grandmother      Past medical history, social, surgical and family history all reviewed in electronic medical record.  No pertanent information unless stated regarding to the chief complaint.   Review of Systems:Review of systems updated and as accurate as of 05/20/17  No headache, visual changes, nausea, vomiting, diarrhea, constipation, dizziness, abdominal pain, skin rash, fevers, chills, night sweats, weight loss, swollen lymph nodes, body aches, joint swelling,chest pain, shortness of breath, mood changes.  Positive  muscle aches  Objective  Blood pressure 100/70, pulse 91, height 5\' 8"  (1.727 m), weight 153 lb (69.4 kg), SpO2 98 %. Systems examined below as of 05/20/17   General: No apparent distress alert and oriented x3 mood and affect normal, dressed appropriately.  HEENT: Pupils equal, extraocular movements intact  Respiratory: Patient's speak in full sentences and does not appear short of breath  Cardiovascular: No lower extremity edema, non tender, no erythema  Skin: Warm dry intact with no signs of infection or rash on extremities or on axial skeleton.  Abdomen: Soft nontender  Neuro: Cranial nerves II through XII are intact, neurovascularly intact in all extremities with  2+ DTRs and 2+ pulses.  Lymph: No lymphadenopathy of posterior or anterior cervical chain or axillae bilaterally.  Gait normal with good balance and coordination.  MSK:  Non tender with full range of motion and good stability and symmetric strength and tone of , elbows, wrist, hip, knee and ankles bilaterally.   Shoulder: left Inspection reveals no abnormalities, atrophy or asymmetry. Palpation is normal with no tenderness over AC joint or bicipital groove. ROM is full in all planes passively. Rotator cuff strength normal throughout. signs of impingement with positive Neer and Hawkin's tests, but negative empty can sign. Speeds and Yergason's tests normal. Positive O'Brien's Normal scapular function observed. No painful arc and no drop arm sign. No apprehension sign  MSK US performed of: left This study was ordered, performed, and interpreted by Charlann Boxer D.O.  Shoulder:   Supraspinatus:  Appears normal on long and transverse views, Bursal bulge seen with shoulder abduction on impingement view. Infraspinatus:  Appears normal on long and transverse views. Significant increase in Doppler flow Subscapularis:  Appears normal on long and transverse views. Positive bursa Teres Minor:  Appears normal on long and transverse views. AC joint:  Capsule undistended, no geyser sign. Glenohumeral Joint:  Appears normal without effusion. Glenoid Labrum: Chronic tear with calcific changes of the posterior labrum noted Biceps Tendon:  Appears normal on long and transverse views, no fraying of tendon, tendon located in intertubercular groove, no subluxation with shoulder internal or external rotation.  Impression: Subacromial bursitis mild with a labral tear  Osteopathic findings C6 flexed rotated and side bent left T3 extended rotated and side bent left inhaled third rib T5 extended rotated and side bent left       Impression and Recommendations:     This case required medical decision  making of moderate complexity.      Note: This dictation was prepared with Dragon dictation along with smaller phrase technology. Any transcriptional errors that result from this process are unintentional.

## 2017-05-20 NOTE — Assessment & Plan Note (Signed)
Discussed posture and ergonomics.  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  Responded well to osteopathic manipulation.  Had improvement in range of motion of the shoulder almost immediately.  We discussed icing regimen and home exercises.  Discussed which activities to do which wants to avoid.  Patient is to increase activity slowly over the course the next several days.  Follow-up again 4 weeks

## 2017-05-20 NOTE — Patient Instructions (Signed)
Good to see you  Ice 20 minutes 2 times daily. Usually after activity and before bed. pennsaid pinkie amount topically 2 times daily as needed.  Keep hands within peripheral vision  Once weekly vitamin D  Over the counter continue the turmeric Add tart cherry extract 1000mg  at night Tried to manipulate a rib and I think it will help  See me again in 4 weeks

## 2017-05-20 NOTE — Assessment & Plan Note (Signed)
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  Discussed with patient about icing regimen.  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  If still having pain we will consider injections.

## 2017-05-20 NOTE — Assessment & Plan Note (Signed)
Home exercises given.  Work with Product/process development scientist.

## 2017-05-20 NOTE — Assessment & Plan Note (Signed)
Decision today to treat with OMT was based on Physical Exam  After verbal consent patient was treated with HVLA, ME, FPR techniques in cervical, thoracic, rib, lumbar and sacral areas  Patient tolerated the procedure well with improvement in symptoms  Patient given exercises, stretches and lifestyle modifications  See medications in patient instructions if given  Patient will follow up in 4 weeks 

## 2017-06-03 ENCOUNTER — Encounter: Payer: Self-pay | Admitting: Family Medicine

## 2017-06-04 ENCOUNTER — Telehealth: Payer: Self-pay | Admitting: Family Medicine

## 2017-06-04 NOTE — Telephone Encounter (Signed)
Copied from [place] [id number]. Topic: Quick Communication - See Telephone Encounter >> Jun 04, 2017 10:31 AM Cleaster Corin, NT wrote: CRM for notification. See Telephone encounter for: 06/04/17.   [facility] calling to receive Prior authorization needed for med. Pennsaid and office notes with diagnosis   [facility], [place], [place] - [address] [place] [address] Phone: [phone] Fax: [phone]

## 2017-06-17 NOTE — Progress Notes (Signed)
[facility] [place], [place] [address] Phone: [phone] Subjective:    CC: Left shoulder pain follow-up  BJY:[id number]  Deborah [name] is a 47 y.o. female coming in with complaint of left shoulder pain.  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  Patient responded fairly well to the conservative therapy and is in increasing activity.  Patient still notices some discomfort and pain with the left shoulder but not as severe as what it was previously.  Feels like she is making progress.  Denies any radiation down the arm.  Patient has been doing the exercises intermittently.  Continues to swim fairly regularly.    *   Past Medical History:  Diagnosis Date  . Raynaud's disease    bilateral feet and hands  . Varicose veins    Past Surgical History:  Procedure Laterality Date  . BREAST LUMPECTOMY  1996   left breast  . ENDOVENOUS ABLATION SAPHENOUS VEIN W/ LASER  03-29-2008   right greater saphenous vein, stab phlebectomy right leg, sclerotherapy   . PATELLAR TENDON REPAIR  1996   left knee  . stab phlebectomy Right 08-26-2012   10-20 incisions by Curt Jews MD   Social History   Socioeconomic History  . Marital status: Married    Spouse name: Not on file  . Number of children: Not on file  . Years of education: Not on file  . Highest education level: Not on file  Occupational History  . Not on file  Social Needs  . Financial resource strain: Not on file  . Food insecurity:    Worry: Not on file    Inability: Not on file  . Transportation needs:    Medical: Not on file    Non-medical: Not on file  Tobacco Use  . Smoking status: Never Smoker  . Smokeless tobacco: Never Used  Substance and Sexual Activity  . Alcohol use: Yes    Comment: social  . Drug use: No  . Sexual activity: Not on file  Lifestyle  . Physical activity:    Days per week: Not on file   Minutes per session: Not on file  . Stress: Not on file  Relationships  . Social connections:    Talks on phone: Not on file    Gets together: Not on file    Attends religious service: Not on file    Active member of club or organization: Not on file    Attends meetings of clubs or organizations: Not on file    Relationship status: Not on file  Other Topics Concern  . Not on file  Social History Narrative  . Not on file   Allergies  Allergen Reactions  . Sulfa Antibiotics    Family History  Problem Relation Age of Onset  . Breast cancer Mother 58  . Breast cancer Maternal Aunt   . Breast cancer Maternal Grandmother      Past medical history, social, surgical and family history all reviewed in electronic medical record.  No pertanent information unless stated regarding to the chief complaint.   Review of Systems:Review of systems updated and as accurate as of 06/18/17  No headache, visual changes, nausea, vomiting, diarrhea, constipation, dizziness, abdominal pain, skin rash, fevers, chills, night sweats, weight loss, swollen lymph nodes, body aches, joint swelling, chest pain, shortness of breath, mood changes.  Positive muscle aches  Objective  Blood pressure  108/80, pulse 76, height 5\' 8"  (1.727 m), weight 160 lb (72.6 kg), SpO2 98 %. Systems examined below as of 06/18/17   General: No apparent distress alert and oriented x3 mood and affect normal, dressed appropriately.  HEENT: Pupils equal, extraocular movements intact  Respiratory: Patient's speak in full sentences and does not appear short of breath  Cardiovascular: No lower extremity edema, non tender, no erythema  Skin: Warm dry intact with no signs of infection or rash on extremities or on axial skeleton.  Abdomen: Soft nontender  Neuro: Cranial nerves II through XII are intact, neurovascularly intact in all extremities with 2+ DTRs and 2+ pulses.  Lymph: No lymphadenopathy of posterior or anterior cervical chain  or axillae bilaterally.  Gait normal with good balance and coordination.  MSK:  Non tender with full range of motion and good stability and symmetric strength and tone of  elbows, wrist, hip, knee and ankles bilaterally.  Shoulder: Left Inspection reveals no abnormalities, atrophy or asymmetry. Palpation is normal with no tenderness over AC joint or bicipital groove. ROM is full in all planes. Rotator cuff strength normal throughout. No signs of impingement with negative Neer and Hawkin's tests, empty can sign. Speeds and Yergason's tests normal. Positive O'Brien's . Normal scapular function observed. No painful arc and no drop arm sign. No apprehension sign Contralateral shoulder unremarkable  Osteopathic findings C6 flexed rotated and side bent left T3 extended rotated and side bent left inhaled third rib T9 extended rotated and side bent left L2 flexed rotated and side bent right Sacrum right on right      Impression and Recommendations:     This case required medical decision making of moderate complexity.      Note: This dictation was prepared with Dragon dictation along with smaller phrase technology. Any transcriptional errors that result from this process are unintentional.

## 2017-06-18 ENCOUNTER — Encounter: Payer: Self-pay | Admitting: Family Medicine

## 2017-06-18 ENCOUNTER — Ambulatory Visit (INDEPENDENT_AMBULATORY_CARE_PROVIDER_SITE_OTHER): Payer: BLUE CROSS/BLUE SHIELD | Admitting: Family Medicine

## 2017-06-18 ENCOUNTER — Other Ambulatory Visit (INDEPENDENT_AMBULATORY_CARE_PROVIDER_SITE_OTHER): Payer: BLUE CROSS/BLUE SHIELD

## 2017-06-18 VITALS — BP 108/80 | HR 76 | Ht 68.0 in | Wt 160.0 lb

## 2017-06-18 DIAGNOSIS — M2550 Pain in unspecified joint: Secondary | ICD-10-CM

## 2017-06-18 DIAGNOSIS — M999 Biomechanical lesion, unspecified: Secondary | ICD-10-CM

## 2017-06-18 DIAGNOSIS — S43432D Superior glenoid labrum lesion of left shoulder, subsequent encounter: Secondary | ICD-10-CM | POA: Diagnosis not present

## 2017-06-18 LAB — COMPREHENSIVE METABOLIC PANEL
ALT: 16 U/L (ref 0–35)
AST: 17 U/L (ref 0–37)
Albumin: 4.5 g/dL (ref 3.5–5.2)
Alkaline Phosphatase: 36 U/L — ABNORMAL LOW (ref 39–117)
BILIRUBIN TOTAL: 0.5 mg/dL (ref 0.2–1.2)
BUN: 12 mg/dL (ref 6–23)
CHLORIDE: 103 meq/L (ref 96–112)
CO2: 30 meq/L (ref 19–32)
CREATININE: 0.7 mg/dL (ref 0.40–1.20)
Calcium: 9.6 mg/dL (ref 8.4–10.5)
GFR: 95.86 mL/min (ref >60.00)
Glucose, Bld: 90 mg/dL (ref 70–99)
Potassium: 3.9 mEq/L (ref 3.5–5.1)
SODIUM: 140 meq/L (ref 135–145)
Total Protein: 7.4 g/dL (ref 6.0–8.3)

## 2017-06-18 LAB — CBC WITH DIFFERENTIAL/PLATELET
BASOS ABS: 0 10*3/uL (ref 0.0–0.1)
Basophils Relative: 0.6 % (ref 0.0–3.0)
EOS ABS: 0.1 10*3/uL (ref 0.0–0.7)
Eosinophils Relative: 0.8 % (ref 0.0–5.0)
HCT: 43.5 % (ref 36.0–46.0)
Hemoglobin: 14.6 g/dL (ref 12.0–15.0)
LYMPHS ABS: 1.9 10*3/uL (ref 0.7–4.0)
Lymphocytes Relative: 28.5 % (ref 12.0–46.0)
MCHC: 33.6 g/dL (ref 30.0–36.0)
MCV: 96.9 fl (ref 78.0–100.0)
MONO ABS: 0.4 10*3/uL (ref 0.1–1.0)
MONOS PCT: 5.6 % (ref 3.0–12.0)
NEUTROS ABS: 4.3 10*3/uL (ref 1.4–7.7)
Neutrophils Relative %: 64.5 % (ref 43.0–77.0)
PLATELETS: 299 10*3/uL (ref 150.0–400.0)
RBC: 4.49 Mil/uL (ref 3.87–5.11)
RDW: 12.5 % (ref 11.5–15.5)
WBC: 6.8 10*3/uL (ref 4.0–10.5)

## 2017-06-18 LAB — SEDIMENTATION RATE: SED RATE: 14 mm/h (ref 0–20)

## 2017-06-18 LAB — IBC PANEL
Iron: 87 ug/dL (ref 42–145)
Saturation Ratios: 25.9 % (ref 20.0–50.0)
Transferrin: 240 mg/dL (ref 212.0–360.0)

## 2017-06-18 LAB — TSH: TSH: 1.43 u[IU]/mL (ref 0.35–4.50)

## 2017-06-18 LAB — VITAMIN B12: Vitamin B-12: >1500 pg/mL — ABNORMAL HIGH (ref 211–911)

## 2017-06-18 LAB — URIC ACID: Uric Acid, Serum: 3.7 mg/dL (ref 2.4–7.0)

## 2017-06-18 LAB — C-REACTIVE PROTEIN: CRP: 0.1 mg/dL — AB (ref 0.5–20.0)

## 2017-06-18 NOTE — Patient Instructions (Signed)
Good to see you  Ice 20 minutes 2 times daily. Usually after activity and before bed. Change your probiotic to one with 10 strains and 10 billion units.  I will check labs today  Stay active  Hip abductor strengthening like you are doing in pilates would be good Exercises on wall.  Heel and butt touching.  Raise leg 6 inches and hold 2 seconds.  Down slow for count of 4 seconds.  1 set of 30 reps daily on both sides.  See me again in 4 weeks and if not better I get to inject hip

## 2017-06-18 NOTE — Assessment & Plan Note (Signed)
Decision today to treat with OMT was based on Physical Exam  After verbal consent patient was treated with HVLA, ME, FPR techniques in cervical, thoracic,  Rib,lumbar and sacral areas  Patient tolerated the procedure well with improvement in symptoms  Patient given exercises, stretches and lifestyle modifications  See medications in patient instructions if given  Patient will follow up in 3-4 weeks

## 2017-06-18 NOTE — Assessment & Plan Note (Signed)
Stable at the moment.  Discussed with patient in great length about icing regimen and home exercises.  Discussed that we could potentially consider injections in the long run.  Follow-up with me again in 3 to 4 weeks

## 2017-06-22 ENCOUNTER — Encounter: Payer: Self-pay | Admitting: Family Medicine

## 2017-06-22 LAB — VITAMIN D 1,25 DIHYDROXY
VITAMIN D 1, 25 (OH) TOTAL: 35 pg/mL (ref 18–72)
VITAMIN D3 1, 25 (OH): 35 pg/mL
Vitamin D2 1, 25 (OH)2: <8 pg/mL

## 2017-06-22 LAB — PTH, INTACT AND CALCIUM
Calcium: 9.8 mg/dL (ref 8.6–10.2)
PTH: 35 pg/mL (ref 14–64)

## 2017-06-22 LAB — CALCIUM, IONIZED: CALCIUM ION: 5.21 mg/dL (ref 4.8–5.6)

## 2017-06-22 LAB — ANA: Anti Nuclear Antibody(ANA): NEGATIVE

## 2017-07-15 NOTE — Progress Notes (Signed)
[facility] [place], [place] [address] Phone: [phone] Subjective:     CC: Shoulder and hip pain follow-up  BJY:NWGNFAOZHY  Deborah [name] is a 47 y.o. female coming in with complaint of shoulder and hip pain. She has not been able to do a lot as she has been sick. Hip is feeling the same as last visit. Shoulder has improved. Patient has not performed any movements that require IR of the shoulder. Is swimming and having better recovery.  Overall she does feel that the shoulder is about 50 to 60% better.  Still having some difficulty though with the hip overall and only 10 to 20% better    Past Medical History:  Diagnosis Date  . Raynaud's disease    bilateral feet and hands  . Varicose veins    Past Surgical History:  Procedure Laterality Date  . BREAST LUMPECTOMY  1996   left breast  . ENDOVENOUS ABLATION SAPHENOUS VEIN W/ LASER  03-29-2008   right greater saphenous vein, stab phlebectomy right leg, sclerotherapy   . PATELLAR TENDON REPAIR  1996   left knee  . stab phlebectomy Right 08-26-2012   10-20 incisions by Curt Jews MD   Social History   Socioeconomic History  . Marital status: Married    Spouse name: Not on file  . Number of children: Not on file  . Years of education: Not on file  . Highest education level: Not on file  Occupational History  . Not on file  Social Needs  . Financial resource strain: Not on file  . Food insecurity:    Worry: Not on file    Inability: Not on file  . Transportation needs:    Medical: Not on file    Non-medical: Not on file  Tobacco Use  . Smoking status: Never Smoker  . Smokeless tobacco: Never Used  Substance and Sexual Activity  . Alcohol use: Yes    Comment: social  . Drug use: No  . Sexual activity: Not on file  Lifestyle  . Physical activity:    Days per week: Not on file    Minutes per session: Not on file  . Stress: Not on file  Relationships  . Social  connections:    Talks on phone: Not on file    Gets together: Not on file    Attends religious service: Not on file    Active member of club or organization: Not on file    Attends meetings of clubs or organizations: Not on file    Relationship status: Not on file  Other Topics Concern  . Not on file  Social History Narrative  . Not on file   Allergies  Allergen Reactions  . Sulfa Antibiotics    Family History  Problem Relation Age of Onset  . Breast cancer Mother 47  . Breast cancer Maternal Aunt   . Breast cancer Maternal Grandmother      Past medical history, social, surgical and family history all reviewed in electronic medical record.  No pertanent information unless stated regarding to the chief complaint.   Review of Systems:Review of systems updated and as accurate as of 07/16/17  No headache, visual changes, nausea, vomiting, diarrhea, constipation, dizziness, abdominal pain, skin rash, fevers, chills, night sweats, weight loss, swollen lymph nodes, body aches, joint swelling, muscle aches, chest pain, shortness of breath, mood changes.   Objective  Blood pressure 108/78, height 5\' 8"  (1.727 m), weight 155  lb (70.3 kg). Systems examined below as of 07/16/17   General: No apparent distress alert and oriented x3 mood and affect normal, dressed appropriately.  HEENT: Pupils equal, extraocular movements intact  Respiratory: Patient's speak in full sentences and does not appear short of breath  Cardiovascular: No lower extremity edema, non tender, no erythema  Skin: Warm dry intact with no signs of infection or rash on extremities or on axial skeleton.  Abdomen: Soft nontender  Neuro: Cranial nerves II through XII are intact, neurovascularly intact in all extremities with 2+ DTRs and 2+ pulses.  Lymph: No lymphadenopathy of posterior or anterior cervical chain or axillae bilaterally.  Gait normal with good balance and coordination.  MSK:  Non tender with full range of  motion and good stability and symmetric strength and tone of  elbows, wrist,  knee and ankles bilaterally.  Left shoulder exam shows some mild positive impingement but near full range of motion at this point.  Patient has full strength of the rotator cuff.  Negative O'Brien's today which is an improvement.  Left hip exam shows a positive Faber on the left side with tenderness over the greater trochanteric area.  Negative straight leg test.  Minimal tenderness over the piriformis.    Impression and Recommendations:     This case required medical decision making of moderate complexity.      Note: This dictation was prepared with Dragon dictation along with smaller phrase technology. Any transcriptional errors that result from this process are unintentional.

## 2017-07-16 ENCOUNTER — Ambulatory Visit (INDEPENDENT_AMBULATORY_CARE_PROVIDER_SITE_OTHER): Payer: BLUE CROSS/BLUE SHIELD | Admitting: Family Medicine

## 2017-07-16 ENCOUNTER — Encounter: Payer: Self-pay | Admitting: Family Medicine

## 2017-07-16 DIAGNOSIS — J029 Acute pharyngitis, unspecified: Secondary | ICD-10-CM | POA: Diagnosis not present

## 2017-07-16 DIAGNOSIS — S43432D Superior glenoid labrum lesion of left shoulder, subsequent encounter: Secondary | ICD-10-CM | POA: Diagnosis not present

## 2017-07-16 DIAGNOSIS — Z6825 Body mass index (BMI) 25.0-25.9, adult: Secondary | ICD-10-CM | POA: Diagnosis not present

## 2017-07-16 DIAGNOSIS — R05 Cough: Secondary | ICD-10-CM | POA: Diagnosis not present

## 2017-07-16 DIAGNOSIS — M7062 Trochanteric bursitis, left hip: Secondary | ICD-10-CM | POA: Diagnosis not present

## 2017-07-16 DIAGNOSIS — J069 Acute upper respiratory infection, unspecified: Secondary | ICD-10-CM | POA: Diagnosis not present

## 2017-07-16 NOTE — Patient Instructions (Signed)
Good to see you  Lets give it more time Ice is your friend pennsaid pinkie amount topically 2 times daily as needed.  Keep doing what you are doing  Call [phone] if you need [place] sooner See me again in 3-6 weeks

## 2017-07-16 NOTE — Assessment & Plan Note (Signed)
Discussed icing ROM HEP  Discussed vitamin D still  RTC in 3-6 weeks

## 2017-07-16 NOTE — Assessment & Plan Note (Signed)
Discussed HEP  Topical nsaids Hip abductors RTC in 3-6 weeks

## 2017-07-30 DIAGNOSIS — Z01419 Encounter for gynecological examination (general) (routine) without abnormal findings: Secondary | ICD-10-CM | POA: Diagnosis not present

## 2017-07-30 DIAGNOSIS — Z808 Family history of malignant neoplasm of other organs or systems: Secondary | ICD-10-CM | POA: Diagnosis not present

## 2017-07-30 DIAGNOSIS — Z803 Family history of malignant neoplasm of breast: Secondary | ICD-10-CM | POA: Diagnosis not present

## 2017-07-30 DIAGNOSIS — Z8042 Family history of malignant neoplasm of prostate: Secondary | ICD-10-CM | POA: Diagnosis not present

## 2017-07-30 DIAGNOSIS — Z6824 Body mass index (BMI) 24.0-24.9, adult: Secondary | ICD-10-CM | POA: Diagnosis not present

## 2017-07-30 DIAGNOSIS — Z8049 Family history of malignant neoplasm of other genital organs: Secondary | ICD-10-CM | POA: Diagnosis not present

## 2017-08-11 ENCOUNTER — Other Ambulatory Visit: Payer: Self-pay | Admitting: Family Medicine

## 2017-08-17 ENCOUNTER — Ambulatory Visit: Payer: BLUE CROSS/BLUE SHIELD | Admitting: Family Medicine

## 2017-08-25 DIAGNOSIS — Z30433 Encounter for removal and reinsertion of intrauterine contraceptive device: Secondary | ICD-10-CM | POA: Diagnosis not present

## 2017-09-03 ENCOUNTER — Encounter: Payer: Self-pay | Admitting: Family Medicine

## 2017-09-03 ENCOUNTER — Ambulatory Visit (INDEPENDENT_AMBULATORY_CARE_PROVIDER_SITE_OTHER): Payer: BLUE CROSS/BLUE SHIELD | Admitting: Family Medicine

## 2017-09-03 ENCOUNTER — Ambulatory Visit (INDEPENDENT_AMBULATORY_CARE_PROVIDER_SITE_OTHER)
Admission: RE | Admit: 2017-09-03 | Discharge: 2017-09-03 | Disposition: A | Discharge status: Home / Self Care | Payer: BLUE CROSS/BLUE SHIELD | Source: Ambulatory Visit | Attending: Family Medicine | Admitting: Family Medicine

## 2017-09-03 ENCOUNTER — Ambulatory Visit: Payer: Self-pay

## 2017-09-03 VITALS — BP 98/68 | HR 78 | Ht 68.0 in | Wt 164.0 lb

## 2017-09-03 DIAGNOSIS — M25552 Pain in left hip: Secondary | ICD-10-CM

## 2017-09-03 DIAGNOSIS — G8929 Other chronic pain: Secondary | ICD-10-CM | POA: Diagnosis not present

## 2017-09-03 DIAGNOSIS — S43432D Superior glenoid labrum lesion of left shoulder, subsequent encounter: Secondary | ICD-10-CM | POA: Diagnosis not present

## 2017-09-03 DIAGNOSIS — M25512 Pain in left shoulder: Secondary | ICD-10-CM | POA: Diagnosis not present

## 2017-09-03 DIAGNOSIS — M7062 Trochanteric bursitis, left hip: Secondary | ICD-10-CM | POA: Diagnosis not present

## 2017-09-03 NOTE — Assessment & Plan Note (Signed)
No improvement with conservative therapy.  Given injection today.  Near complete resolution of pain immediately.  Discussed icing regimen and home exercise.  Discussed which activities to do which wants to avoid.  Patient is to increase activity slowly.  Follow-up again in 4 to 8 weeks

## 2017-09-03 NOTE — Assessment & Plan Note (Signed)
Stable

## 2017-09-03 NOTE — Patient Instructions (Signed)
Good to see you  Ice the hip  Vitamin D 4000 IU daily  Stay active Xray of shoulder downstairs See me again in 2-3 months

## 2017-09-03 NOTE — Progress Notes (Signed)
[facility] [place], [place] [address] Phone: [phone] Subjective:    CC: hip and shoulder pain   KPT:[id number]  Deborah [name] is a 47 y.o. female coming in with complaint of hippain and shoulder pain. Pain in the hip is unchanged. States that hip feels bruised. Activity does not bother her. Does continue to be active despite pain.  Patient does have shoulder pain and did have a labral tear.  Has been having intermittent pain for quite some time.  Patient denies any significant worsening.  Patient rates the severity pain is a 6 out of 10  Swimming still irritates the shoulder as well as body weight abduction and flexion.   Hip pain still from an injury.  Refill muscle and.  Feels like there is still some mild swelling.  Can wake her up at night.  Rates the severity pain is 8 out of 10    Past Medical History:  Diagnosis Date  . Raynaud's disease    bilateral feet and hands  . Varicose veins    Past Surgical History:  Procedure Laterality Date  . BREAST LUMPECTOMY  1996   left breast  . ENDOVENOUS ABLATION SAPHENOUS VEIN W/ LASER  03-29-2008   right greater saphenous vein, stab phlebectomy right leg, sclerotherapy   . PATELLAR TENDON REPAIR  1996   left knee  . stab phlebectomy Right 08-26-2012   10-20 incisions by Curt Jews MD   Social History   Socioeconomic History  . Marital status: Married    Spouse name: Not on file  . Number of children: Not on file  . Years of education: Not on file  . Highest education level: Not on file  Occupational History  . Not on file  Social Needs  . Financial resource strain: Not on file  . Food insecurity:    Worry: Not on file    Inability: Not on file  . Transportation needs:    Medical: Not on file    Non-medical: Not on file  Tobacco Use  . Smoking status: Never Smoker  . Smokeless tobacco: Never Used  Substance and Sexual Activity  . Alcohol use: Yes    Comment: social  . Drug  use: No  . Sexual activity: Not on file  Lifestyle  . Physical activity:    Days per week: Not on file    Minutes per session: Not on file  . Stress: Not on file  Relationships  . Social connections:    Talks on phone: Not on file    Gets together: Not on file    Attends religious service: Not on file    Active member of club or organization: Not on file    Attends meetings of clubs or organizations: Not on file    Relationship status: Not on file  Other Topics Concern  . Not on file  Social History Narrative  . Not on file   Allergies  Allergen Reactions  . Sulfa Antibiotics    Family History  Problem Relation Age of Onset  . Breast cancer Mother 37  . Breast cancer Maternal Aunt   . Breast cancer Maternal Grandmother      Past medical history, social, surgical and family history all reviewed in electronic medical record.  No pertanent information unless stated regarding to the chief complaint.   Review of Systems:Review of systems updated and as accurate as of 09/03/17  No headache, visual changes, nausea, vomiting, diarrhea, constipation,  dizziness, abdominal pain, skin rash, fevers, chills, night sweats, weight loss, swollen lymph nodes, body aches, joint swelling, muscle aches, chest pain, shortness of breath, mood changes.   Objective  Blood pressure 98/68, pulse 78, height 5\' 8"  (1.727 m), weight 164 lb (74.4 kg), SpO2 99 %. Systems examined below as of 09/03/17   General: No apparent distress alert and oriented x3 mood and affect normal, dressed appropriately.  HEENT: Pupils equal, extraocular movements intact  Respiratory: Patient's speak in full sentences and does not appear short of breath  Cardiovascular: No lower extremity edema, non tender, no erythema  Skin: Warm dry intact with no signs of infection or rash on extremities or on axial skeleton.  Abdomen: Soft nontender  Neuro: Cranial nerves II through XII are intact, neurovascularly intact in all  extremities with 2+ DTRs and 2+ pulses.  Lymph: No lymphadenopathy of posterior or anterior cervical chain or axillae bilaterally.  Gait normal with good balance and coordination.  MSK:  Non tender with full range of motion and good stability and symmetric strength and tone of shoulders, elbows, wrist, hip, knee and ankles bilaterally.  Left hip exam shows positive Corky Sox.  Severe tenderness to palpation of the paraspinal musculature as well as over the greater trochanteric area.  Mild decreased range of motion of less than 5 degrees with external range of motion.  Negative straight leg test.\\  Left shoulder exam shows the patient has near full range of motion.  Mild crepitus.  Patient has negative crossover today for mild positive O'Brien's.  No significant weakness of the rotator cuff.   Procedure: Real-time Ultrasound Guided Injection of left  greater trochanteric bursitis secondary to patient's body habitus Device: GE Logiq Q7  Ultrasound guided injection is preferred based studies that show increased duration, increased effect, greater accuracy, decreased procedural pain, increased response rate, and decreased cost with ultrasound guided versus blind injection.  Verbal informed consent obtained.  Time-out conducted.  Noted no overlying erythema, induration, or other signs of local infection.  Skin prepped in a sterile fashion.  Local anesthesia: Topical Ethyl chloride.  With sterile technique and under real time ultrasound guidance:  Greater trochanteric area was visualized and patient's bursa was noted. A 22-gauge 3 inch needle was inserted and 4 cc of 0.5% Marcaine and 1 cc of Kenalog 40 mg/dL was injected. Pictures taken Completed without difficulty  Pain immediately resolved suggesting accurate placement of the medication.  Advised to call if fevers/chills, erythema, induration, drainage, or persistent bleeding.  Images permanently stored and available for review in the ultrasound unit.   Impression: Technically successful ultrasound guided injection.   Impression and Recommendations:     This case required medical decision making of moderate complexity.      Note: This dictation was prepared with Dragon dictation along with smaller phrase technology. Any transcriptional errors that result from this process are unintentional.

## 2017-09-14 DIAGNOSIS — H04123 Dry eye syndrome of bilateral lacrimal glands: Secondary | ICD-10-CM | POA: Diagnosis not present

## 2017-09-14 DIAGNOSIS — H5213 Myopia, bilateral: Secondary | ICD-10-CM | POA: Diagnosis not present

## 2017-09-21 ENCOUNTER — Other Ambulatory Visit: Payer: Self-pay | Admitting: Obstetrics and Gynecology

## 2017-09-21 DIAGNOSIS — Z803 Family history of malignant neoplasm of breast: Secondary | ICD-10-CM

## 2017-09-21 DIAGNOSIS — N939 Abnormal uterine and vaginal bleeding, unspecified: Secondary | ICD-10-CM | POA: Diagnosis not present

## 2017-09-21 DIAGNOSIS — Z09 Encounter for follow-up examination after completed treatment for conditions other than malignant neoplasm: Secondary | ICD-10-CM | POA: Diagnosis not present

## 2017-10-29 ENCOUNTER — Ambulatory Visit
Admission: RE | Admit: 2017-10-29 | Discharge: 2017-10-29 | Disposition: A | Discharge status: Home / Self Care | Payer: BLUE CROSS/BLUE SHIELD | Source: Ambulatory Visit | Attending: Obstetrics and Gynecology | Admitting: Obstetrics and Gynecology

## 2017-10-29 DIAGNOSIS — Z803 Family history of malignant neoplasm of breast: Secondary | ICD-10-CM

## 2017-10-29 MED ORDER — GADOBENATE DIMEGLUMINE 529 MG/ML IV SOLN
14.0000 mL | Freq: Once | INTRAVENOUS | Status: AC | PRN
  Administered 2017-10-29: 14 mL via INTRAVENOUS

## 2017-11-05 DIAGNOSIS — Z0000 Encounter for general adult medical examination without abnormal findings: Secondary | ICD-10-CM | POA: Diagnosis not present

## 2017-11-12 ENCOUNTER — Ambulatory Visit: Payer: BLUE CROSS/BLUE SHIELD | Admitting: Family Medicine

## 2017-11-12 DIAGNOSIS — Z1389 Encounter for screening for other disorder: Secondary | ICD-10-CM | POA: Diagnosis not present

## 2017-11-12 DIAGNOSIS — Z0000 Encounter for general adult medical examination without abnormal findings: Secondary | ICD-10-CM | POA: Diagnosis not present

## 2017-11-12 DIAGNOSIS — Z23 Encounter for immunization: Secondary | ICD-10-CM | POA: Diagnosis not present

## 2017-11-12 DIAGNOSIS — Z6825 Body mass index (BMI) 25.0-25.9, adult: Secondary | ICD-10-CM | POA: Diagnosis not present

## 2017-11-19 DIAGNOSIS — Z1212 Encounter for screening for malignant neoplasm of rectum: Secondary | ICD-10-CM | POA: Diagnosis not present

## 2017-12-21 ENCOUNTER — Other Ambulatory Visit: Payer: Self-pay | Admitting: Obstetrics and Gynecology

## 2017-12-21 DIAGNOSIS — Z1231 Encounter for screening mammogram for malignant neoplasm of breast: Secondary | ICD-10-CM

## 2018-02-03 HISTORY — PX: ANTERIOR CRUCIATE LIGAMENT REPAIR: SHX115

## 2018-03-09 DIAGNOSIS — M25561 Pain in right knee: Secondary | ICD-10-CM | POA: Diagnosis not present

## 2018-03-12 DIAGNOSIS — M25561 Pain in right knee: Secondary | ICD-10-CM | POA: Diagnosis not present

## 2018-03-15 DIAGNOSIS — M25561 Pain in right knee: Secondary | ICD-10-CM | POA: Diagnosis not present

## 2018-03-25 DIAGNOSIS — Y9323 Activity, snow (alpine) (downhill) skiing, snow boarding, sledding, tobogganing and snow tubing: Secondary | ICD-10-CM | POA: Diagnosis not present

## 2018-03-25 DIAGNOSIS — S83211A Bucket-handle tear of medial meniscus, current injury, right knee, initial encounter: Secondary | ICD-10-CM | POA: Diagnosis not present

## 2018-03-25 DIAGNOSIS — S83511A Sprain of anterior cruciate ligament of right knee, initial encounter: Secondary | ICD-10-CM | POA: Diagnosis not present

## 2018-03-25 DIAGNOSIS — G8918 Other acute postprocedural pain: Secondary | ICD-10-CM | POA: Diagnosis not present

## 2018-03-31 DIAGNOSIS — M25561 Pain in right knee: Secondary | ICD-10-CM | POA: Diagnosis not present

## 2018-04-01 DIAGNOSIS — S83511D Sprain of anterior cruciate ligament of right knee, subsequent encounter: Secondary | ICD-10-CM | POA: Diagnosis not present

## 2018-04-01 DIAGNOSIS — Z4789 Encounter for other orthopedic aftercare: Secondary | ICD-10-CM | POA: Diagnosis not present

## 2018-04-05 DIAGNOSIS — S83511D Sprain of anterior cruciate ligament of right knee, subsequent encounter: Secondary | ICD-10-CM | POA: Diagnosis not present

## 2018-04-05 DIAGNOSIS — Z4789 Encounter for other orthopedic aftercare: Secondary | ICD-10-CM | POA: Diagnosis not present

## 2018-04-08 DIAGNOSIS — Z4789 Encounter for other orthopedic aftercare: Secondary | ICD-10-CM | POA: Diagnosis not present

## 2018-04-08 DIAGNOSIS — S83511D Sprain of anterior cruciate ligament of right knee, subsequent encounter: Secondary | ICD-10-CM | POA: Diagnosis not present

## 2018-04-12 DIAGNOSIS — S83511D Sprain of anterior cruciate ligament of right knee, subsequent encounter: Secondary | ICD-10-CM | POA: Diagnosis not present

## 2018-04-12 DIAGNOSIS — Z4789 Encounter for other orthopedic aftercare: Secondary | ICD-10-CM | POA: Diagnosis not present

## 2018-04-13 ENCOUNTER — Ambulatory Visit
Admission: RE | Admit: 2018-04-13 | Discharge: 2018-04-13 | Disposition: A | Discharge status: Home / Self Care | Payer: BLUE CROSS/BLUE SHIELD | Source: Ambulatory Visit | Attending: Obstetrics and Gynecology | Admitting: Obstetrics and Gynecology

## 2018-04-13 DIAGNOSIS — Z1231 Encounter for screening mammogram for malignant neoplasm of breast: Secondary | ICD-10-CM | POA: Diagnosis not present

## 2018-04-15 DIAGNOSIS — S83511D Sprain of anterior cruciate ligament of right knee, subsequent encounter: Secondary | ICD-10-CM | POA: Diagnosis not present

## 2018-04-15 DIAGNOSIS — Z4789 Encounter for other orthopedic aftercare: Secondary | ICD-10-CM | POA: Diagnosis not present

## 2018-04-19 DIAGNOSIS — S83511D Sprain of anterior cruciate ligament of right knee, subsequent encounter: Secondary | ICD-10-CM | POA: Diagnosis not present

## 2018-04-19 DIAGNOSIS — Z4789 Encounter for other orthopedic aftercare: Secondary | ICD-10-CM | POA: Diagnosis not present

## 2018-04-22 DIAGNOSIS — S83511D Sprain of anterior cruciate ligament of right knee, subsequent encounter: Secondary | ICD-10-CM | POA: Diagnosis not present

## 2018-04-22 DIAGNOSIS — Z4789 Encounter for other orthopedic aftercare: Secondary | ICD-10-CM | POA: Diagnosis not present

## 2018-04-26 DIAGNOSIS — Z4789 Encounter for other orthopedic aftercare: Secondary | ICD-10-CM | POA: Diagnosis not present

## 2018-04-26 DIAGNOSIS — S83511D Sprain of anterior cruciate ligament of right knee, subsequent encounter: Secondary | ICD-10-CM | POA: Diagnosis not present

## 2018-04-29 DIAGNOSIS — Z4789 Encounter for other orthopedic aftercare: Secondary | ICD-10-CM | POA: Diagnosis not present

## 2018-04-29 DIAGNOSIS — S83511D Sprain of anterior cruciate ligament of right knee, subsequent encounter: Secondary | ICD-10-CM | POA: Diagnosis not present

## 2018-05-03 DIAGNOSIS — Z4789 Encounter for other orthopedic aftercare: Secondary | ICD-10-CM | POA: Diagnosis not present

## 2018-05-03 DIAGNOSIS — S83511D Sprain of anterior cruciate ligament of right knee, subsequent encounter: Secondary | ICD-10-CM | POA: Diagnosis not present

## 2018-05-06 DIAGNOSIS — Z4789 Encounter for other orthopedic aftercare: Secondary | ICD-10-CM | POA: Diagnosis not present

## 2018-05-06 DIAGNOSIS — S83511D Sprain of anterior cruciate ligament of right knee, subsequent encounter: Secondary | ICD-10-CM | POA: Diagnosis not present

## 2018-05-17 DIAGNOSIS — S83511D Sprain of anterior cruciate ligament of right knee, subsequent encounter: Secondary | ICD-10-CM | POA: Diagnosis not present

## 2018-05-17 DIAGNOSIS — Z4789 Encounter for other orthopedic aftercare: Secondary | ICD-10-CM | POA: Diagnosis not present

## 2018-05-20 DIAGNOSIS — S83511D Sprain of anterior cruciate ligament of right knee, subsequent encounter: Secondary | ICD-10-CM | POA: Diagnosis not present

## 2018-05-20 DIAGNOSIS — Z4789 Encounter for other orthopedic aftercare: Secondary | ICD-10-CM | POA: Diagnosis not present

## 2018-05-24 DIAGNOSIS — S83511D Sprain of anterior cruciate ligament of right knee, subsequent encounter: Secondary | ICD-10-CM | POA: Diagnosis not present

## 2018-05-24 DIAGNOSIS — Z4789 Encounter for other orthopedic aftercare: Secondary | ICD-10-CM | POA: Diagnosis not present

## 2018-05-27 DIAGNOSIS — Z4789 Encounter for other orthopedic aftercare: Secondary | ICD-10-CM | POA: Diagnosis not present

## 2018-05-27 DIAGNOSIS — S83511D Sprain of anterior cruciate ligament of right knee, subsequent encounter: Secondary | ICD-10-CM | POA: Diagnosis not present

## 2018-06-01 DIAGNOSIS — Z4789 Encounter for other orthopedic aftercare: Secondary | ICD-10-CM | POA: Diagnosis not present

## 2018-06-01 DIAGNOSIS — S83511D Sprain of anterior cruciate ligament of right knee, subsequent encounter: Secondary | ICD-10-CM | POA: Diagnosis not present

## 2018-06-03 DIAGNOSIS — S83511D Sprain of anterior cruciate ligament of right knee, subsequent encounter: Secondary | ICD-10-CM | POA: Diagnosis not present

## 2018-06-03 DIAGNOSIS — Z4789 Encounter for other orthopedic aftercare: Secondary | ICD-10-CM | POA: Diagnosis not present

## 2018-06-08 DIAGNOSIS — Z4789 Encounter for other orthopedic aftercare: Secondary | ICD-10-CM | POA: Diagnosis not present

## 2018-06-08 DIAGNOSIS — S83511D Sprain of anterior cruciate ligament of right knee, subsequent encounter: Secondary | ICD-10-CM | POA: Diagnosis not present

## 2018-06-10 DIAGNOSIS — Z4789 Encounter for other orthopedic aftercare: Secondary | ICD-10-CM | POA: Diagnosis not present

## 2018-06-10 DIAGNOSIS — S83511D Sprain of anterior cruciate ligament of right knee, subsequent encounter: Secondary | ICD-10-CM | POA: Diagnosis not present

## 2018-06-14 DIAGNOSIS — Z4789 Encounter for other orthopedic aftercare: Secondary | ICD-10-CM | POA: Diagnosis not present

## 2018-06-14 DIAGNOSIS — S83511D Sprain of anterior cruciate ligament of right knee, subsequent encounter: Secondary | ICD-10-CM | POA: Diagnosis not present

## 2018-06-22 DIAGNOSIS — S83511D Sprain of anterior cruciate ligament of right knee, subsequent encounter: Secondary | ICD-10-CM | POA: Diagnosis not present

## 2018-06-22 DIAGNOSIS — Z4789 Encounter for other orthopedic aftercare: Secondary | ICD-10-CM | POA: Diagnosis not present

## 2018-06-25 ENCOUNTER — Other Ambulatory Visit: Payer: Self-pay | Admitting: Obstetrics and Gynecology

## 2018-06-25 DIAGNOSIS — Z803 Family history of malignant neoplasm of breast: Secondary | ICD-10-CM

## 2018-06-29 DIAGNOSIS — D2262 Melanocytic nevi of left upper limb, including shoulder: Secondary | ICD-10-CM | POA: Diagnosis not present

## 2018-06-29 DIAGNOSIS — L738 Other specified follicular disorders: Secondary | ICD-10-CM | POA: Diagnosis not present

## 2018-06-29 DIAGNOSIS — L821 Other seborrheic keratosis: Secondary | ICD-10-CM | POA: Diagnosis not present

## 2018-06-29 DIAGNOSIS — I788 Other diseases of capillaries: Secondary | ICD-10-CM | POA: Diagnosis not present

## 2018-06-29 DIAGNOSIS — L820 Inflamed seborrheic keratosis: Secondary | ICD-10-CM | POA: Diagnosis not present

## 2018-07-01 DIAGNOSIS — Z4789 Encounter for other orthopedic aftercare: Secondary | ICD-10-CM | POA: Diagnosis not present

## 2018-07-01 DIAGNOSIS — S83511D Sprain of anterior cruciate ligament of right knee, subsequent encounter: Secondary | ICD-10-CM | POA: Diagnosis not present

## 2018-07-20 DIAGNOSIS — Z4789 Encounter for other orthopedic aftercare: Secondary | ICD-10-CM | POA: Diagnosis not present

## 2018-07-20 DIAGNOSIS — S83511D Sprain of anterior cruciate ligament of right knee, subsequent encounter: Secondary | ICD-10-CM | POA: Diagnosis not present

## 2018-07-21 DIAGNOSIS — Z9889 Other specified postprocedural states: Secondary | ICD-10-CM | POA: Diagnosis not present

## 2018-07-21 DIAGNOSIS — S83511D Sprain of anterior cruciate ligament of right knee, subsequent encounter: Secondary | ICD-10-CM | POA: Diagnosis not present

## 2018-07-29 DIAGNOSIS — Z4789 Encounter for other orthopedic aftercare: Secondary | ICD-10-CM | POA: Diagnosis not present

## 2018-07-29 DIAGNOSIS — S83511D Sprain of anterior cruciate ligament of right knee, subsequent encounter: Secondary | ICD-10-CM | POA: Diagnosis not present

## 2018-08-11 DIAGNOSIS — Z4789 Encounter for other orthopedic aftercare: Secondary | ICD-10-CM | POA: Diagnosis not present

## 2018-08-11 DIAGNOSIS — S83511D Sprain of anterior cruciate ligament of right knee, subsequent encounter: Secondary | ICD-10-CM | POA: Diagnosis not present

## 2018-08-17 DIAGNOSIS — Z4789 Encounter for other orthopedic aftercare: Secondary | ICD-10-CM | POA: Diagnosis not present

## 2018-08-17 DIAGNOSIS — S83511D Sprain of anterior cruciate ligament of right knee, subsequent encounter: Secondary | ICD-10-CM | POA: Diagnosis not present

## 2018-08-26 DIAGNOSIS — J029 Acute pharyngitis, unspecified: Secondary | ICD-10-CM | POA: Diagnosis not present

## 2018-08-26 DIAGNOSIS — Z20828 Contact with and (suspected) exposure to other viral communicable diseases: Secondary | ICD-10-CM | POA: Diagnosis not present

## 2018-08-26 DIAGNOSIS — R51 Headache: Secondary | ICD-10-CM | POA: Diagnosis not present

## 2018-08-26 DIAGNOSIS — R5383 Other fatigue: Secondary | ICD-10-CM | POA: Diagnosis not present

## 2018-08-26 DIAGNOSIS — R05 Cough: Secondary | ICD-10-CM | POA: Diagnosis not present

## 2018-08-26 DIAGNOSIS — R110 Nausea: Secondary | ICD-10-CM | POA: Diagnosis not present

## 2018-09-07 DIAGNOSIS — S83511D Sprain of anterior cruciate ligament of right knee, subsequent encounter: Secondary | ICD-10-CM | POA: Diagnosis not present

## 2018-09-21 DIAGNOSIS — Z01419 Encounter for gynecological examination (general) (routine) without abnormal findings: Secondary | ICD-10-CM | POA: Diagnosis not present

## 2018-09-21 DIAGNOSIS — Z6825 Body mass index (BMI) 25.0-25.9, adult: Secondary | ICD-10-CM | POA: Diagnosis not present

## 2018-09-22 DIAGNOSIS — Z4789 Encounter for other orthopedic aftercare: Secondary | ICD-10-CM | POA: Diagnosis not present

## 2018-09-22 DIAGNOSIS — S83511D Sprain of anterior cruciate ligament of right knee, subsequent encounter: Secondary | ICD-10-CM | POA: Diagnosis not present

## 2018-10-05 DIAGNOSIS — Z4789 Encounter for other orthopedic aftercare: Secondary | ICD-10-CM | POA: Diagnosis not present

## 2018-10-05 DIAGNOSIS — S83511D Sprain of anterior cruciate ligament of right knee, subsequent encounter: Secondary | ICD-10-CM | POA: Diagnosis not present

## 2018-10-19 DIAGNOSIS — Z4789 Encounter for other orthopedic aftercare: Secondary | ICD-10-CM | POA: Diagnosis not present

## 2018-10-19 DIAGNOSIS — S83511D Sprain of anterior cruciate ligament of right knee, subsequent encounter: Secondary | ICD-10-CM | POA: Diagnosis not present

## 2018-10-28 DIAGNOSIS — H04123 Dry eye syndrome of bilateral lacrimal glands: Secondary | ICD-10-CM | POA: Diagnosis not present

## 2018-10-28 DIAGNOSIS — H5213 Myopia, bilateral: Secondary | ICD-10-CM | POA: Diagnosis not present

## 2018-11-01 ENCOUNTER — Ambulatory Visit
Admission: RE | Admit: 2018-11-01 | Discharge: 2018-11-01 | Disposition: A | Discharge status: Home / Self Care | Payer: BC Managed Care – PPO | Source: Ambulatory Visit | Attending: Obstetrics and Gynecology | Admitting: Obstetrics and Gynecology

## 2018-11-01 DIAGNOSIS — Z853 Personal history of malignant neoplasm of breast: Secondary | ICD-10-CM | POA: Diagnosis not present

## 2018-11-01 DIAGNOSIS — Z803 Family history of malignant neoplasm of breast: Secondary | ICD-10-CM

## 2018-11-01 MED ORDER — GADOBUTROL 1 MMOL/ML IV SOLN
8.0000 mL | Freq: Once | INTRAVENOUS | Status: AC | PRN
  Administered 2018-11-01: 8 mL via INTRAVENOUS

## 2018-11-02 DIAGNOSIS — Z4789 Encounter for other orthopedic aftercare: Secondary | ICD-10-CM | POA: Diagnosis not present

## 2018-11-02 DIAGNOSIS — S83511D Sprain of anterior cruciate ligament of right knee, subsequent encounter: Secondary | ICD-10-CM | POA: Diagnosis not present

## 2018-11-10 DIAGNOSIS — Z23 Encounter for immunization: Secondary | ICD-10-CM | POA: Diagnosis not present

## 2018-11-10 DIAGNOSIS — Z0000 Encounter for general adult medical examination without abnormal findings: Secondary | ICD-10-CM | POA: Diagnosis not present

## 2018-11-12 DIAGNOSIS — Z1212 Encounter for screening for malignant neoplasm of rectum: Secondary | ICD-10-CM | POA: Diagnosis not present

## 2018-11-17 DIAGNOSIS — Z1331 Encounter for screening for depression: Secondary | ICD-10-CM | POA: Diagnosis not present

## 2018-11-17 DIAGNOSIS — Z0000 Encounter for general adult medical examination without abnormal findings: Secondary | ICD-10-CM | POA: Diagnosis not present

## 2018-11-18 DIAGNOSIS — Z20828 Contact with and (suspected) exposure to other viral communicable diseases: Secondary | ICD-10-CM | POA: Diagnosis not present

## 2018-12-22 DIAGNOSIS — Z20828 Contact with and (suspected) exposure to other viral communicable diseases: Secondary | ICD-10-CM | POA: Diagnosis not present

## 2018-12-26 DIAGNOSIS — Z20828 Contact with and (suspected) exposure to other viral communicable diseases: Secondary | ICD-10-CM | POA: Diagnosis not present

## 2019-02-28 ENCOUNTER — Other Ambulatory Visit: Payer: BC Managed Care – PPO

## 2019-02-28 ENCOUNTER — Ambulatory Visit: Payer: Self-pay | Attending: Internal Medicine

## 2019-02-28 DIAGNOSIS — Z20822 Contact with and (suspected) exposure to covid-19: Secondary | ICD-10-CM

## 2019-03-01 LAB — NOVEL CORONAVIRUS, NAA: SARS-CoV-2, NAA: NOT DETECTED

## 2019-04-01 ENCOUNTER — Other Ambulatory Visit: Payer: Self-pay | Admitting: Obstetrics and Gynecology

## 2019-04-01 DIAGNOSIS — Z1231 Encounter for screening mammogram for malignant neoplasm of breast: Secondary | ICD-10-CM

## 2019-05-11 ENCOUNTER — Ambulatory Visit: Payer: Self-pay

## 2019-06-27 DIAGNOSIS — D225 Melanocytic nevi of trunk: Secondary | ICD-10-CM | POA: Diagnosis not present

## 2019-06-27 DIAGNOSIS — D2271 Melanocytic nevi of right lower limb, including hip: Secondary | ICD-10-CM | POA: Diagnosis not present

## 2019-06-27 DIAGNOSIS — D2371 Other benign neoplasm of skin of right lower limb, including hip: Secondary | ICD-10-CM | POA: Diagnosis not present

## 2019-07-01 ENCOUNTER — Other Ambulatory Visit: Payer: Self-pay

## 2019-07-01 ENCOUNTER — Ambulatory Visit
Admission: RE | Admit: 2019-07-01 | Discharge: 2019-07-01 | Disposition: A | Discharge status: Home / Self Care | Payer: BLUE CROSS/BLUE SHIELD | Source: Ambulatory Visit | Attending: Obstetrics and Gynecology | Admitting: Obstetrics and Gynecology

## 2019-07-01 DIAGNOSIS — Z1231 Encounter for screening mammogram for malignant neoplasm of breast: Secondary | ICD-10-CM | POA: Diagnosis not present

## 2019-07-05 ENCOUNTER — Encounter: Payer: Self-pay | Admitting: Family Medicine

## 2019-07-05 ENCOUNTER — Ambulatory Visit (INDEPENDENT_AMBULATORY_CARE_PROVIDER_SITE_OTHER): Payer: BLUE CROSS/BLUE SHIELD

## 2019-07-05 ENCOUNTER — Other Ambulatory Visit: Payer: Self-pay

## 2019-07-05 ENCOUNTER — Ambulatory Visit (INDEPENDENT_AMBULATORY_CARE_PROVIDER_SITE_OTHER): Payer: BLUE CROSS/BLUE SHIELD | Admitting: Family Medicine

## 2019-07-05 VITALS — BP 108/68 | HR 67 | Ht 68.0 in | Wt 159.0 lb

## 2019-07-05 DIAGNOSIS — M1611 Unilateral primary osteoarthritis, right hip: Secondary | ICD-10-CM | POA: Diagnosis not present

## 2019-07-05 DIAGNOSIS — M7061 Trochanteric bursitis, right hip: Secondary | ICD-10-CM

## 2019-07-05 DIAGNOSIS — M25551 Pain in right hip: Secondary | ICD-10-CM | POA: Diagnosis not present

## 2019-07-05 MED ORDER — MELOXICAM 15 MG PO TABS
15.0000 mg | ORAL_TABLET | Freq: Every day | ORAL | 0 refills | Status: AC
Start: 2019-07-05 — End: ?

## 2019-07-05 NOTE — Assessment & Plan Note (Signed)
Patient given injection today, tolerated the procedure well, discussed icing regimen and home exercises.  Patient will work on hip abductor strengthening and hip flexor stretching.  We will get x-rays to ensure no impingement is contributing.  Discussed that if continuing pain continue meloxicam and refill given today.  Follow-up again in 4 to 6 weeks

## 2019-07-05 NOTE — Patient Instructions (Addendum)
Xray today Injected Right GT Exercises 3x a week Ice 20 min a day See me again in 4-6 weeks

## 2019-07-05 NOTE — Progress Notes (Signed)
[place] Phone: [phone] Subjective:   Deborah [name], am serving as a scribe for Dr. Hulan Saas. This visit occurred during the SARS-CoV-2 public health emergency.  Safety protocols were in place, including screening questions prior to the visit, additional usage of staff PPE, and extensive cleaning of exam room while observing appropriate contact time as indicated for disinfecting solutions.   I'm seeing this patient by the request  of:  Marton Redwood, MD  CC: Right sided hip pain  [id number]  Deborah [name] is a 49 y.o. female coming in with complaint of right hip pain. Last seen in 2019 for left GT bursitis. Patient states that she is now having pain over GT of right hip. Tore ACL in February 2020. Had surgery and did rehab 2020. Hip pain can radiate into right foot and into back. If patient stands up quickly she cannot take a step or her leg won't support her. Walking increases pain as well as standing. 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 Wears support socks to help with bloodflow due to varicose veins. Does complain of lower back pain as well.  Patient denies though any true radicular symptoms in the leg.  States that the pain can keep her from doing activities on a regular basis at the moment.  Patient denies any numbness or tingling.       Past Medical History:  Diagnosis Date  . Raynaud's disease    bilateral feet and hands  . Varicose veins    Past Surgical History:  Procedure Laterality Date  . BREAST LUMPECTOMY  1996   left breast  . ENDOVENOUS ABLATION SAPHENOUS VEIN W/ LASER  03-29-2008   right greater saphenous vein, stab phlebectomy right leg, sclerotherapy   . PATELLAR TENDON REPAIR  1996   left knee  . stab phlebectomy Right 08-26-2012   10-20 incisions by Curt Jews MD   Social History   Socioeconomic History  . Marital status: Married    Spouse name: Not  on file  . Number of children: Not on file  . Years of education: Not on file  . Highest education level: Not on file  Occupational History  . Not on file  Tobacco Use  . Smoking status: Never Smoker  . Smokeless tobacco: Never Used  Substance and Sexual Activity  . Alcohol use: Yes    Comment: social  . Drug use: [name]  . Sexual activity: Not on file  Other Topics Concern  . Not on file  Social History Narrative  . Not on file   Social Determinants of Health   Financial Resource Strain:   . Difficulty of Paying Living Expenses:   Food Insecurity:   . Worried About Charity fundraiser in the Last Year:   . Arboriculturist in the Last Year:   Transportation Needs:   . Film/video editor (Medical):   Marland Kitchen Lack of Transportation (Non-Medical):   Physical Activity:   . Days of Exercise per Week:   . Minutes of Exercise per Session:   Stress:   . Feeling of Stress :   Social Connections:   . Frequency of Communication with Friends and Family:   . Frequency of Social Gatherings with Friends and Family:   . Attends Religious Services:   . Active Member of Clubs or Organizations:   . Attends Archivist Meetings:   Marland Kitchen Marital Status:  Allergies  Allergen Reactions  . Sulfa Antibiotics    Family History  Problem Relation Age of Onset  . Breast cancer Mother 54  . Breast cancer Maternal Aunt   . Breast cancer Maternal Grandmother        Current Outpatient Medications (Analgesics):  .  meloxicam (MOBIC) 15 MG tablet, Take 1 tablet (15 mg total) by mouth daily.  Current Outpatient Medications (Hematological):  .  vitamin B-12 (CYANOCOBALAMIN) 100 MCG tablet, Take 10 mcg by mouth daily.  Current Outpatient Medications (Other):  Marland Kitchen  Cholecalciferol (VITAMIN D) 2000 UNITS tablet, Take 5,000 Units by mouth daily.  .  Diclofenac Sodium (PENNSAID) 2 % SOLN, Place 2 g onto the skin 2 (two) times daily. .  Multiple Vitamin (MULTIVITAMIN) tablet, Take 1 tablet by  mouth daily. .  Probiotic Product (PROBIOTIC-10 PO), Take 100 mcg by mouth. .  TURMERIC PO, Take by mouth. .  Vitamin D, Ergocalciferol, (DRISDOL) 50000 units CAPS capsule, TAKE 1 CAPSULE (50,000 UNITS TOTAL) BY MOUTH EVERY 7 (SEVEN) DAYS. Marland Kitchen  VITAMIN K PO, Take by mouth.   Reviewed prior external information including notes and imaging from  primary care provider As well as notes that were available from care everywhere and other healthcare systems.  Past medical history, social, surgical and family history all reviewed in electronic medical record.  [name] pertanent information unless stated regarding to the chief complaint.   Review of Systems:  [name] headache, visual changes, nausea, vomiting, diarrhea, constipation, dizziness, abdominal pain, skin rash, fevers, chills, night sweats, weight loss, swollen lymph nodes, body aches, joint swelling, chest pain, shortness of breath, mood changes. POSITIVE muscle aches  Objective  Blood pressure 108/68, pulse 67, height 5\' 8"  (1.727 m), weight 159 lb (72.1 kg), SpO2 93 %.   General: [name] apparent distress alert and oriented x3 mood and affect normal, dressed appropriately.  HEENT: Pupils equal, extraocular movements intact  Respiratory: Patient's speak in full sentences and does not appear short of breath  Cardiovascular: [name] lower extremity edema, non tender, [name] erythema  Neuro: Cranial nerves II through XII are intact, neurovascularly intact in all extremities with 2+ DTRs and 2+ pulses.  Gait normal with good balance and coordination.  MSK: Mild tender with full range of motion and good stability and symmetric strength and tone of shoulders, elbows, wrist, knee and ankles bilaterally.  Right hip exam shows the patient does have tenderness to palpation of the lateral aspect of the right hip.  Positive FABER test.  Does have significant tightness of the gluteal area as well.  [name] pain with internal rotation of the right hip.  Negative straight leg test.   [name] masses appreciated on the side of the hip.  After verbal consent patient was prepped in sterile fashion with alcohol swabs. Ethyl chloride used patient was injected with a 22-gauge 3 inch needle into the RIGHT lateral hip in the greater trochanteric area under ultrasound guidance. Picture was taken. Patient had 4 cc of 0.5% Marcaine and 1 cc of Kenalog 40 mg/dL injected. Patient tolerated the procedure well and [name] blood loss. Pain completely resolved after injection stating proper placement. Post injection instructions given.   Impression and Recommendations:     The above documentation has been reviewed and is accurate and complete Lyndal Pulley, DO       Note: This dictation was prepared with Dragon dictation along with smaller phrase technology. Any transcriptional errors that result from this process are unintentional.

## 2019-07-06 ENCOUNTER — Encounter: Payer: Self-pay | Admitting: Family Medicine

## 2019-07-11 ENCOUNTER — Ambulatory Visit: Payer: Self-pay | Admitting: Family Medicine

## 2019-07-12 ENCOUNTER — Ambulatory Visit: Payer: Self-pay | Admitting: Family Medicine

## 2019-07-22 ENCOUNTER — Other Ambulatory Visit: Payer: Self-pay | Admitting: Neurosurgery

## 2019-07-22 ENCOUNTER — Other Ambulatory Visit: Payer: Self-pay | Admitting: Obstetrics and Gynecology

## 2019-07-22 DIAGNOSIS — Z803 Family history of malignant neoplasm of breast: Secondary | ICD-10-CM

## 2019-08-10 ENCOUNTER — Telehealth: Payer: Self-pay | Admitting: Family Medicine

## 2019-08-10 DIAGNOSIS — M7989 Other specified soft tissue disorders: Secondary | ICD-10-CM

## 2019-08-10 NOTE — Telephone Encounter (Signed)
Vascular ultrasound to assess for DVT ordered to the [facility] line location.  Please contact patient and see [facility] to  to contact patient to schedule DVT ultrasound.  This could be done today or tomorrow or even the next day.

## 2019-08-10 NOTE — Telephone Encounter (Signed)
[facility] will take too long. Ordered for [facility]. They will call to schedule.

## 2019-08-10 NOTE — Telephone Encounter (Signed)
Called [name] at [facility] and LM for her to call pt to schedule.

## 2019-08-10 NOTE — Telephone Encounter (Signed)
To clarify.  Concern was for DVT.  Patient had a hike and then a long flight preceding her leg swelling.  Does have a history of varicose veins.  No concern for arterial problem.

## 2019-08-10 NOTE — Telephone Encounter (Signed)
Lorraine/Physical Therapist ( cell [phone] ) called with fairly urgent concerns about this pt. They have been working on foot/ankle but pt is having atypical hip pain. Edwena Felty is concerned this is vascular/arterial and thinks a doppler might be needed, hip pain is high up she is concerned about femoral artery.  Pt has history of varicose veins, is very compliant with her care and is in compression, but hip pain is causing a lot of discomfort.

## 2019-08-10 NOTE — Telephone Encounter (Signed)
Spoke with patient letting her know that [facility] will be calling their office to schedule her with them today. Patient voices understanding.

## 2019-08-11 ENCOUNTER — Other Ambulatory Visit: Payer: Self-pay

## 2019-08-11 ENCOUNTER — Ambulatory Visit (HOSPITAL_BASED_OUTPATIENT_CLINIC_OR_DEPARTMENT_OTHER)
Admission: RE | Admit: 2019-08-11 | Discharge: 2019-08-11 | Disposition: A | Discharge status: Home / Self Care | Payer: BLUE CROSS/BLUE SHIELD | Source: Ambulatory Visit | Attending: Family Medicine | Admitting: Family Medicine

## 2019-08-11 DIAGNOSIS — M7989 Other specified soft tissue disorders: Secondary | ICD-10-CM | POA: Diagnosis not present

## 2019-08-11 NOTE — Progress Notes (Signed)
No DVT seen.  Ruling out DVT was the most important thing.  You have an appointment scheduled with Dr. Tamala Julian next week.  If having dramatically worsening pain before then we can arrange for more testing however I think following up with Dr. Tamala Julian is probably the best idea at this point.

## 2019-08-16 ENCOUNTER — Encounter: Payer: Self-pay | Admitting: Family Medicine

## 2019-08-16 ENCOUNTER — Other Ambulatory Visit: Payer: Self-pay

## 2019-08-16 ENCOUNTER — Other Ambulatory Visit (INDEPENDENT_AMBULATORY_CARE_PROVIDER_SITE_OTHER): Payer: BLUE CROSS/BLUE SHIELD

## 2019-08-16 ENCOUNTER — Ambulatory Visit (INDEPENDENT_AMBULATORY_CARE_PROVIDER_SITE_OTHER): Payer: BLUE CROSS/BLUE SHIELD | Admitting: Family Medicine

## 2019-08-16 VITALS — BP 108/68 | HR 81 | Ht 68.0 in | Wt 159.0 lb

## 2019-08-16 DIAGNOSIS — M25551 Pain in right hip: Secondary | ICD-10-CM | POA: Diagnosis not present

## 2019-08-16 DIAGNOSIS — M2550 Pain in unspecified joint: Secondary | ICD-10-CM

## 2019-08-16 LAB — COMPREHENSIVE METABOLIC PANEL
ALT: 11 U/L (ref 0–35)
AST: 15 U/L (ref 0–37)
Albumin: 4.4 g/dL (ref 3.5–5.2)
Alkaline Phosphatase: 33 U/L — ABNORMAL LOW (ref 39–117)
BUN: 13 mg/dL (ref 6–23)
CO2: 30 mEq/L (ref 19–32)
Calcium: 9.2 mg/dL (ref 8.4–10.5)
Chloride: 102 mEq/L (ref 96–112)
Creatinine, Ser: 0.64 mg/dL (ref 0.40–1.20)
GFR: 99.09 mL/min (ref >60.00)
Glucose, Bld: 94 mg/dL (ref 70–99)
Potassium: 3.9 mEq/L (ref 3.5–5.1)
Sodium: 138 mEq/L (ref 135–145)
Total Bilirubin: 0.7 mg/dL (ref 0.2–1.2)
Total Protein: 6.9 g/dL (ref 6.0–8.3)

## 2019-08-16 LAB — TSH: TSH: 0.98 u[IU]/mL (ref 0.35–4.50)

## 2019-08-16 LAB — CBC WITH DIFFERENTIAL/PLATELET
Basophils Absolute: 0 10*3/uL (ref 0.0–0.1)
Basophils Relative: 0.5 % (ref 0.0–3.0)
Eosinophils Absolute: 0.1 10*3/uL (ref 0.0–0.7)
Eosinophils Relative: 0.7 % (ref 0.0–5.0)
HCT: 39.4 % (ref 36.0–46.0)
Hemoglobin: 13.3 g/dL (ref 12.0–15.0)
Lymphocytes Relative: 22.9 % (ref 12.0–46.0)
Lymphs Abs: 1.8 10*3/uL (ref 0.7–4.0)
MCHC: 33.8 g/dL (ref 30.0–36.0)
MCV: 98 fl (ref 78.0–100.0)
Monocytes Absolute: 0.5 10*3/uL (ref 0.1–1.0)
Monocytes Relative: 6.5 % (ref 3.0–12.0)
Neutro Abs: 5.4 10*3/uL (ref 1.4–7.7)
Neutrophils Relative %: 69.4 % (ref 43.0–77.0)
Platelets: 303 10*3/uL (ref 150.0–400.0)
RBC: 4.02 Mil/uL (ref 3.87–5.11)
RDW: 12.8 % (ref 11.5–15.5)
WBC: 7.8 10*3/uL (ref 4.0–10.5)

## 2019-08-16 LAB — IRON: Iron: 129 ug/dL (ref 42–145)

## 2019-08-16 LAB — VITAMIN D 25 HYDROXY (VIT D DEFICIENCY, FRACTURES): VITD: 28.85 ng/mL — ABNORMAL LOW (ref 30.00–100.00)

## 2019-08-16 LAB — SEDIMENTATION RATE: Sed Rate: 11 mm/hr (ref 0–20)

## 2019-08-16 LAB — C-REACTIVE PROTEIN: CRP: <1 mg/dL (ref 0.5–20.0)

## 2019-08-16 LAB — URIC ACID: Uric Acid, Serum: 3.1 mg/dL (ref 2.4–7.0)

## 2019-08-16 LAB — FERRITIN: Ferritin: 53.1 ng/mL (ref 10.0–291.0)

## 2019-08-16 NOTE — Patient Instructions (Addendum)
Good to see you  Labs today  Ice 20 minutes 2 times daily. Usually after activity and before bed. We will get MR- Arthrogram of theh ip  I will write you and discuss next step

## 2019-08-16 NOTE — Assessment & Plan Note (Addendum)
Patient's right hip does not seem to be improving at this point I do feel that further imaging including MRI arthrogram is necessary.  Patient has known arthritic changes minorly of the acetabulum that we need to further evaluate.  Patient will have laboratory work-up as well to make sure there is nothing else that is contributing to the pain and decreasing patient's healing appropriateness.  Patient will continue all the medications including the meloxicam if is helpful.  Follow-up again in 4 to 8 weeks or after imaging.

## 2019-08-16 NOTE — Progress Notes (Signed)
[place] Phone: [phone] Subjective:   Deborah [name], am serving as a scribe for Dr. Hulan [name]. This visit occurred during the SARS-CoV-2 public health emergency.  Safety protocols were in place, including screening questions prior to the visit, additional usage of staff PPE, and extensive cleaning of exam room while observing appropriate contact time as indicated for disinfecting solutions.   I'm seeing this patient by the request  of:  Deborah Redwood, MD  CC: Right hip pain  [id number]   07/05/2019 Patient given injection today, tolerated the procedure well, discussed icing regimen and home exercises.  Patient will work on hip abductor strengthening and hip flexor stretching.  We will get x-rays to ensure [name] impingement is contributing.  Discussed that if continuing pain continue meloxicam and refill given today.  Follow-up again in 4 to 6 weeks  Update 08/16/2019 Deborah [name] is a 49 y.o. female coming in with complaint of right hip pain. Has since had doppler to r/u DVT. Pain continues over GT and radiates into right side of lower back. Back pain is constant. Injection did not help. Is getting dry needling done which did help. Feels like her legs and feet are also hurting since last visit. Is wearing support hose which have diminished deep, throbbing pain. Notes pain at night when she is lying down.  X-rays of the hip did show that there is some acetabular degenerative changes noted.  These were independently visualized by me    Past Medical History:  Diagnosis Date  . Raynaud's disease    bilateral feet and hands  . Varicose veins    Past Surgical History:  Procedure Laterality Date  . BREAST LUMPECTOMY  1996   left breast  . ENDOVENOUS ABLATION SAPHENOUS VEIN W/ LASER  03-29-2008   right greater saphenous vein, stab phlebectomy right leg, sclerotherapy   . PATELLAR TENDON REPAIR  1996   left knee    . stab phlebectomy Right 08-26-2012   10-20 incisions by Deborah Jews MD   Social History   Socioeconomic History  . Marital status: Married    Spouse name: Not on file  . Number of children: Not on file  . Years of education: Not on file  . Highest education level: Not on file  Occupational History  . Not on file  Tobacco Use  . Smoking status: Never Smoker  . Smokeless tobacco: Never Used  Substance and Sexual Activity  . Alcohol use: Yes    Comment: social  . Drug use: [name]  . Sexual activity: Not on file  Other Topics Concern  . Not on file  Social History Narrative  . Not on file   Social Determinants of Health   Financial Resource Strain:   . Difficulty of Paying Living Expenses:   Food Insecurity:   . Worried About Charity fundraiser in the Last Year:   . Arboriculturist in the Last Year:   Transportation Needs:   . Film/video editor (Medical):   Marland Kitchen Lack of Transportation (Non-Medical):   Physical Activity:   . Days of Exercise per Week:   . Minutes of Exercise per Session:   Stress:   . Feeling of Stress :   Social Connections:   . Frequency of Communication with Friends and Family:   . Frequency of Social Gatherings with Friends and Family:   . Attends Religious Services:   . Active Member of  Clubs or Organizations:   . Attends Archivist Meetings:   Marland Kitchen Marital Status:    Allergies  Allergen Reactions  . Sulfa Antibiotics    Family History  Problem Relation Age of Onset  . Breast cancer Mother 72  . Breast cancer Maternal Aunt   . Breast cancer Maternal Grandmother        Current Outpatient Medications (Analgesics):  .  meloxicam (MOBIC) 15 MG tablet, Take 1 tablet (15 mg total) by mouth daily.  Current Outpatient Medications (Hematological):  .  vitamin B-12 (CYANOCOBALAMIN) 100 MCG tablet, Take 10 mcg by mouth daily.  Current Outpatient Medications (Other):  Marland Kitchen  Cholecalciferol (VITAMIN D) 2000 UNITS tablet, Take 5,000 Units  by mouth daily.  .  Diclofenac Sodium (PENNSAID) 2 % SOLN, Place 2 g onto the skin 2 (two) times daily. .  Multiple Vitamin (MULTIVITAMIN) tablet, Take 1 tablet by mouth daily. .  Probiotic Product (PROBIOTIC-10 PO), Take 100 mcg by mouth. .  TURMERIC PO, Take by mouth. .  Vitamin D, Ergocalciferol, (DRISDOL) 50000 units CAPS capsule, TAKE 1 CAPSULE (50,000 UNITS TOTAL) BY MOUTH EVERY 7 (SEVEN) DAYS. Marland Kitchen  VITAMIN K PO, Take by mouth.   Reviewed prior external information including notes and imaging from  primary care provider As well as notes that were available from care everywhere and other healthcare systems.  Past medical history, social, surgical and family history all reviewed in electronic medical record.  [name] pertanent information unless stated regarding to the chief complaint.   Review of Systems:  [name] headache, visual changes, nausea, vomiting, diarrhea, constipation, dizziness, abdominal pain, skin rash, fevers, chills, night sweats, weight loss, swollen lymph nodes, body aches, joint swelling, chest pain, shortness of breath, mood changes. POSITIVE muscle aches  Objective  Blood pressure 108/68, pulse 81, height 5\' 8"  (1.727 m), weight 159 lb (72.1 kg), SpO2 97 %.   General: [name] apparent distress alert and oriented x3 mood and affect normal, dressed appropriately.  HEENT: Pupils equal, extraocular movements intact  Respiratory: Patient's speak in full sentences and does not appear short of breath  Cardiovascular: [name] lower extremity edema, non tender, [name] erythema  Neuro: Cranial nerves II through XII are intact, neurovascularly intact in all extremities with 2+ DTRs and 2+ pulses.  Gait normal with good balance and coordination.  MSK:   Right hip exam still shows the patient has some decreasing range of motion in internal and external rotation of 5 to 10 degrees.  Increasing discomfort still over the greater trochanteric area compared to the contralateral side.  Negative straight  leg test.  Mild pain over the sacroiliac joint    Impression and Recommendations:     The above documentation has been reviewed and is accurate and complete Deborah Pulley, DO       Note: This dictation was prepared with Dragon dictation along with smaller phrase technology. Any transcriptional errors that result from this process are unintentional.

## 2019-08-18 LAB — CEA: CEA: <0.5 ng/mL

## 2019-08-18 LAB — ANA: Anti Nuclear Antibody (ANA): NEGATIVE

## 2019-08-18 LAB — RHEUMATOID FACTOR: Rheumatoid fact SerPl-aCnc: <14 IU/mL (ref <14)

## 2019-08-18 LAB — CALCIUM, IONIZED: Calcium, Ion: 5.16 mg/dL (ref 4.8–5.6)

## 2019-08-18 LAB — CYCLIC CITRUL PEPTIDE ANTIBODY, IGG: Cyclic Citrullin Peptide Ab: <16 UNITS

## 2019-08-18 LAB — ANGIOTENSIN CONVERTING ENZYME: Angiotensin-Converting Enzyme: 12 U/L (ref 9–67)

## 2019-08-18 LAB — PTH, INTACT AND CALCIUM
Calcium: 9.8 mg/dL (ref 8.6–10.2)
PTH: 46 pg/mL (ref 14–64)

## 2019-08-18 LAB — CA 125: CA 125: 7 U/mL (ref <35)

## 2019-09-05 ENCOUNTER — Other Ambulatory Visit: Payer: Self-pay

## 2019-09-05 ENCOUNTER — Ambulatory Visit
Admission: RE | Admit: 2019-09-05 | Discharge: 2019-09-05 | Disposition: A | Discharge status: Home / Self Care | Payer: BLUE CROSS/BLUE SHIELD | Source: Ambulatory Visit | Attending: Family Medicine | Admitting: Family Medicine

## 2019-09-05 DIAGNOSIS — M25551 Pain in right hip: Secondary | ICD-10-CM | POA: Diagnosis not present

## 2019-09-05 MED ORDER — IOPAMIDOL (ISOVUE-M 200) INJECTION 41%
9.0000 mL | Freq: Once | INTRAMUSCULAR | Status: AC
  Administered 2019-09-05: 9 mL via INTRA_ARTICULAR

## 2019-09-06 ENCOUNTER — Encounter: Payer: Self-pay | Admitting: Family Medicine

## 2019-09-07 ENCOUNTER — Other Ambulatory Visit: Payer: Self-pay

## 2019-09-07 DIAGNOSIS — S73191D Other sprain of right hip, subsequent encounter: Secondary | ICD-10-CM

## 2019-09-19 DIAGNOSIS — M25551 Pain in right hip: Secondary | ICD-10-CM | POA: Diagnosis not present

## 2019-09-22 ENCOUNTER — Encounter: Payer: Self-pay | Admitting: Genetic Counselor

## 2019-09-23 DIAGNOSIS — M24151 Other articular cartilage disorders, right hip: Secondary | ICD-10-CM | POA: Diagnosis not present

## 2019-09-30 DIAGNOSIS — M25551 Pain in right hip: Secondary | ICD-10-CM | POA: Diagnosis not present

## 2019-10-04 DIAGNOSIS — M25551 Pain in right hip: Secondary | ICD-10-CM | POA: Diagnosis not present

## 2019-10-07 DIAGNOSIS — M25551 Pain in right hip: Secondary | ICD-10-CM | POA: Diagnosis not present

## 2019-10-11 DIAGNOSIS — M25551 Pain in right hip: Secondary | ICD-10-CM | POA: Diagnosis not present

## 2019-10-13 DIAGNOSIS — Z01419 Encounter for gynecological examination (general) (routine) without abnormal findings: Secondary | ICD-10-CM | POA: Diagnosis not present

## 2019-10-13 DIAGNOSIS — Z803 Family history of malignant neoplasm of breast: Secondary | ICD-10-CM | POA: Diagnosis not present

## 2019-10-13 DIAGNOSIS — Z30431 Encounter for routine checking of intrauterine contraceptive device: Secondary | ICD-10-CM | POA: Diagnosis not present

## 2019-10-13 DIAGNOSIS — Z6825 Body mass index (BMI) 25.0-25.9, adult: Secondary | ICD-10-CM | POA: Diagnosis not present

## 2019-10-14 DIAGNOSIS — M25551 Pain in right hip: Secondary | ICD-10-CM | POA: Diagnosis not present

## 2019-10-17 DIAGNOSIS — M25551 Pain in right hip: Secondary | ICD-10-CM | POA: Diagnosis not present

## 2019-10-20 DIAGNOSIS — M25551 Pain in right hip: Secondary | ICD-10-CM | POA: Diagnosis not present

## 2019-10-24 DIAGNOSIS — M25551 Pain in right hip: Secondary | ICD-10-CM | POA: Diagnosis not present

## 2019-10-26 DIAGNOSIS — M25551 Pain in right hip: Secondary | ICD-10-CM | POA: Diagnosis not present

## 2019-11-03 DIAGNOSIS — M25551 Pain in right hip: Secondary | ICD-10-CM | POA: Diagnosis not present

## 2019-11-04 DIAGNOSIS — M24151 Other articular cartilage disorders, right hip: Secondary | ICD-10-CM | POA: Diagnosis not present

## 2019-11-08 DIAGNOSIS — M25551 Pain in right hip: Secondary | ICD-10-CM | POA: Diagnosis not present

## 2019-11-15 DIAGNOSIS — Z0000 Encounter for general adult medical examination without abnormal findings: Secondary | ICD-10-CM | POA: Diagnosis not present

## 2019-11-16 DIAGNOSIS — M25551 Pain in right hip: Secondary | ICD-10-CM | POA: Diagnosis not present

## 2019-11-22 DIAGNOSIS — Z1389 Encounter for screening for other disorder: Secondary | ICD-10-CM | POA: Diagnosis not present

## 2019-11-22 DIAGNOSIS — Z0000 Encounter for general adult medical examination without abnormal findings: Secondary | ICD-10-CM | POA: Diagnosis not present

## 2019-11-22 DIAGNOSIS — R82998 Other abnormal findings in urine: Secondary | ICD-10-CM | POA: Diagnosis not present

## 2019-11-22 DIAGNOSIS — M25851 Other specified joint disorders, right hip: Secondary | ICD-10-CM | POA: Diagnosis not present

## 2019-11-22 DIAGNOSIS — Z1331 Encounter for screening for depression: Secondary | ICD-10-CM | POA: Diagnosis not present

## 2019-11-22 DIAGNOSIS — Z23 Encounter for immunization: Secondary | ICD-10-CM | POA: Diagnosis not present

## 2019-11-23 DIAGNOSIS — M25551 Pain in right hip: Secondary | ICD-10-CM | POA: Diagnosis not present

## 2019-11-25 DIAGNOSIS — H524 Presbyopia: Secondary | ICD-10-CM | POA: Diagnosis not present

## 2019-11-25 DIAGNOSIS — H04123 Dry eye syndrome of bilateral lacrimal glands: Secondary | ICD-10-CM | POA: Diagnosis not present

## 2019-11-25 DIAGNOSIS — H5213 Myopia, bilateral: Secondary | ICD-10-CM | POA: Diagnosis not present

## 2019-11-30 DIAGNOSIS — M25551 Pain in right hip: Secondary | ICD-10-CM | POA: Diagnosis not present

## 2019-12-07 DIAGNOSIS — M25551 Pain in right hip: Secondary | ICD-10-CM | POA: Diagnosis not present

## 2019-12-14 DIAGNOSIS — M25551 Pain in right hip: Secondary | ICD-10-CM | POA: Diagnosis not present

## 2019-12-21 DIAGNOSIS — M25551 Pain in right hip: Secondary | ICD-10-CM | POA: Diagnosis not present

## 2019-12-28 DIAGNOSIS — M25551 Pain in right hip: Secondary | ICD-10-CM | POA: Diagnosis not present

## 2020-01-04 ENCOUNTER — Other Ambulatory Visit: Payer: BLUE CROSS/BLUE SHIELD

## 2020-01-11 ENCOUNTER — Ambulatory Visit
Admission: RE | Admit: 2020-01-11 | Discharge: 2020-01-11 | Disposition: A | Discharge status: Home / Self Care | Payer: BLUE CROSS/BLUE SHIELD | Source: Ambulatory Visit | Attending: Obstetrics and Gynecology | Admitting: Obstetrics and Gynecology

## 2020-01-11 ENCOUNTER — Other Ambulatory Visit: Payer: Self-pay

## 2020-01-11 DIAGNOSIS — Z803 Family history of malignant neoplasm of breast: Secondary | ICD-10-CM

## 2020-01-11 DIAGNOSIS — N6489 Other specified disorders of breast: Secondary | ICD-10-CM | POA: Diagnosis not present

## 2020-01-11 MED ORDER — GADOBUTROL 1 MMOL/ML IV SOLN
7.0000 mL | Freq: Once | INTRAVENOUS | Status: AC | PRN
  Administered 2020-01-11: 7 mL via INTRAVENOUS

## 2020-01-18 DIAGNOSIS — M25551 Pain in right hip: Secondary | ICD-10-CM | POA: Diagnosis not present

## 2020-01-24 DIAGNOSIS — Z1212 Encounter for screening for malignant neoplasm of rectum: Secondary | ICD-10-CM | POA: Diagnosis not present

## 2020-02-08 DIAGNOSIS — M25551 Pain in right hip: Secondary | ICD-10-CM | POA: Diagnosis not present

## 2020-02-13 DIAGNOSIS — Z1159 Encounter for screening for other viral diseases: Secondary | ICD-10-CM | POA: Diagnosis not present

## 2020-02-29 DIAGNOSIS — M25551 Pain in right hip: Secondary | ICD-10-CM | POA: Diagnosis not present

## 2020-03-21 DIAGNOSIS — M25551 Pain in right hip: Secondary | ICD-10-CM | POA: Diagnosis not present

## 2020-04-25 DIAGNOSIS — M25551 Pain in right hip: Secondary | ICD-10-CM | POA: Diagnosis not present

## 2020-05-15 DIAGNOSIS — Z1159 Encounter for screening for other viral diseases: Secondary | ICD-10-CM | POA: Diagnosis not present

## 2020-05-16 DIAGNOSIS — M25551 Pain in right hip: Secondary | ICD-10-CM | POA: Diagnosis not present

## 2020-06-01 ENCOUNTER — Other Ambulatory Visit: Payer: Self-pay | Admitting: Obstetrics and Gynecology

## 2020-06-01 DIAGNOSIS — M25551 Pain in right hip: Secondary | ICD-10-CM | POA: Diagnosis not present

## 2020-06-01 DIAGNOSIS — Z1231 Encounter for screening mammogram for malignant neoplasm of breast: Secondary | ICD-10-CM

## 2020-06-15 DIAGNOSIS — M25551 Pain in right hip: Secondary | ICD-10-CM | POA: Diagnosis not present

## 2020-06-29 DIAGNOSIS — M25551 Pain in right hip: Secondary | ICD-10-CM | POA: Diagnosis not present

## 2020-07-13 DIAGNOSIS — M25551 Pain in right hip: Secondary | ICD-10-CM | POA: Diagnosis not present

## 2020-07-24 ENCOUNTER — Ambulatory Visit
Admission: RE | Admit: 2020-07-24 | Discharge: 2020-07-24 | Disposition: A | Discharge status: Home / Self Care | Payer: BLUE CROSS/BLUE SHIELD | Source: Ambulatory Visit | Attending: Obstetrics and Gynecology | Admitting: Obstetrics and Gynecology

## 2020-07-24 ENCOUNTER — Other Ambulatory Visit: Payer: Self-pay

## 2020-07-24 DIAGNOSIS — Z1231 Encounter for screening mammogram for malignant neoplasm of breast: Secondary | ICD-10-CM | POA: Diagnosis not present

## 2020-07-25 ENCOUNTER — Other Ambulatory Visit: Payer: Self-pay | Admitting: Obstetrics and Gynecology

## 2020-07-25 DIAGNOSIS — R928 Other abnormal and inconclusive findings on diagnostic imaging of breast: Secondary | ICD-10-CM

## 2020-07-25 DIAGNOSIS — D2261 Melanocytic nevi of right upper limb, including shoulder: Secondary | ICD-10-CM | POA: Diagnosis not present

## 2020-07-25 DIAGNOSIS — D2262 Melanocytic nevi of left upper limb, including shoulder: Secondary | ICD-10-CM | POA: Diagnosis not present

## 2020-07-25 DIAGNOSIS — L573 Poikiloderma of Civatte: Secondary | ICD-10-CM | POA: Diagnosis not present

## 2020-07-25 DIAGNOSIS — L821 Other seborrheic keratosis: Secondary | ICD-10-CM | POA: Diagnosis not present

## 2020-07-25 DIAGNOSIS — M25551 Pain in right hip: Secondary | ICD-10-CM | POA: Diagnosis not present

## 2020-07-30 ENCOUNTER — Other Ambulatory Visit: Payer: Self-pay

## 2020-07-30 ENCOUNTER — Ambulatory Visit
Admission: RE | Admit: 2020-07-30 | Discharge: 2020-07-30 | Disposition: A | Discharge status: Home / Self Care | Payer: BLUE CROSS/BLUE SHIELD | Source: Ambulatory Visit | Attending: Obstetrics and Gynecology | Admitting: Obstetrics and Gynecology

## 2020-07-30 DIAGNOSIS — R928 Other abnormal and inconclusive findings on diagnostic imaging of breast: Secondary | ICD-10-CM

## 2020-07-30 DIAGNOSIS — R922 Inconclusive mammogram: Secondary | ICD-10-CM | POA: Diagnosis not present

## 2020-08-03 ENCOUNTER — Other Ambulatory Visit: Payer: Self-pay | Admitting: Obstetrics and Gynecology

## 2020-08-03 DIAGNOSIS — Z803 Family history of malignant neoplasm of breast: Secondary | ICD-10-CM

## 2020-08-15 ENCOUNTER — Other Ambulatory Visit: Payer: BLUE CROSS/BLUE SHIELD

## 2020-08-15 DIAGNOSIS — M25551 Pain in right hip: Secondary | ICD-10-CM | POA: Diagnosis not present

## 2020-10-05 DIAGNOSIS — M25551 Pain in right hip: Secondary | ICD-10-CM | POA: Diagnosis not present

## 2020-10-19 DIAGNOSIS — M25551 Pain in right hip: Secondary | ICD-10-CM | POA: Diagnosis not present

## 2020-11-08 DIAGNOSIS — M25551 Pain in right hip: Secondary | ICD-10-CM | POA: Diagnosis not present

## 2020-11-21 DIAGNOSIS — N951 Menopausal and female climacteric states: Secondary | ICD-10-CM | POA: Diagnosis not present

## 2020-11-21 DIAGNOSIS — Z01419 Encounter for gynecological examination (general) (routine) without abnormal findings: Secondary | ICD-10-CM | POA: Diagnosis not present

## 2020-11-21 DIAGNOSIS — Z6825 Body mass index (BMI) 25.0-25.9, adult: Secondary | ICD-10-CM | POA: Diagnosis not present

## 2020-11-21 DIAGNOSIS — N912 Amenorrhea, unspecified: Secondary | ICD-10-CM | POA: Diagnosis not present

## 2020-12-04 DIAGNOSIS — L650 Telogen effluvium: Secondary | ICD-10-CM | POA: Diagnosis not present

## 2020-12-04 DIAGNOSIS — I7300 Raynaud's syndrome without gangrene: Secondary | ICD-10-CM | POA: Diagnosis not present

## 2020-12-04 DIAGNOSIS — Z0000 Encounter for general adult medical examination without abnormal findings: Secondary | ICD-10-CM | POA: Diagnosis not present

## 2020-12-04 DIAGNOSIS — R7989 Other specified abnormal findings of blood chemistry: Secondary | ICD-10-CM | POA: Diagnosis not present

## 2020-12-06 ENCOUNTER — Other Ambulatory Visit: Payer: BLUE CROSS/BLUE SHIELD

## 2020-12-12 DIAGNOSIS — Z1331 Encounter for screening for depression: Secondary | ICD-10-CM | POA: Diagnosis not present

## 2020-12-12 DIAGNOSIS — Z1339 Encounter for screening examination for other mental health and behavioral disorders: Secondary | ICD-10-CM | POA: Diagnosis not present

## 2020-12-12 DIAGNOSIS — I868 Varicose veins of other specified sites: Secondary | ICD-10-CM | POA: Diagnosis not present

## 2020-12-12 DIAGNOSIS — Z0000 Encounter for general adult medical examination without abnormal findings: Secondary | ICD-10-CM | POA: Diagnosis not present

## 2020-12-12 DIAGNOSIS — Z23 Encounter for immunization: Secondary | ICD-10-CM | POA: Diagnosis not present

## 2020-12-26 ENCOUNTER — Other Ambulatory Visit: Payer: BLUE CROSS/BLUE SHIELD

## 2021-01-10 DIAGNOSIS — H04123 Dry eye syndrome of bilateral lacrimal glands: Secondary | ICD-10-CM | POA: Diagnosis not present

## 2021-01-10 DIAGNOSIS — H524 Presbyopia: Secondary | ICD-10-CM | POA: Diagnosis not present

## 2021-01-10 DIAGNOSIS — H5213 Myopia, bilateral: Secondary | ICD-10-CM | POA: Diagnosis not present

## 2021-02-06 ENCOUNTER — Encounter: Payer: Self-pay | Admitting: Internal Medicine

## 2021-02-13 ENCOUNTER — Other Ambulatory Visit: Payer: Self-pay

## 2021-02-13 ENCOUNTER — Ambulatory Visit
Admission: RE | Admit: 2021-02-13 | Discharge: 2021-02-13 | Disposition: A | Discharge status: Home / Self Care | Payer: BC Managed Care – PPO | Source: Ambulatory Visit | Attending: Obstetrics and Gynecology | Admitting: Obstetrics and Gynecology

## 2021-02-13 DIAGNOSIS — R928 Other abnormal and inconclusive findings on diagnostic imaging of breast: Secondary | ICD-10-CM | POA: Diagnosis not present

## 2021-02-13 DIAGNOSIS — Z803 Family history of malignant neoplasm of breast: Secondary | ICD-10-CM

## 2021-02-13 MED ORDER — GADOBUTROL 1 MMOL/ML IV SOLN
7.0000 mL | Freq: Once | INTRAVENOUS | Status: AC | PRN
  Administered 2021-02-13: 7 mL via INTRAVENOUS

## 2021-03-15 ENCOUNTER — Ambulatory Visit (AMBULATORY_SURGERY_CENTER): Payer: BC Managed Care – PPO | Admitting: *Deleted

## 2021-03-15 ENCOUNTER — Other Ambulatory Visit: Payer: Self-pay

## 2021-03-15 VITALS — Ht 67.0 in | Wt 154.0 lb

## 2021-03-15 DIAGNOSIS — Z1211 Encounter for screening for malignant neoplasm of colon: Secondary | ICD-10-CM

## 2021-03-15 MED ORDER — NA SULFATE-K SULFATE-MG SULF 17.5-3.13-1.6 GM/177ML PO SOLN: 2.0000 | Freq: Once | ORAL | 0 refills | Status: AC

## 2021-03-15 NOTE — Progress Notes (Signed)

## 2021-03-27 ENCOUNTER — Encounter: Payer: Self-pay | Admitting: Internal Medicine

## 2021-03-29 ENCOUNTER — Other Ambulatory Visit: Payer: Self-pay

## 2021-03-29 ENCOUNTER — Encounter: Payer: Self-pay | Admitting: Internal Medicine

## 2021-03-29 ENCOUNTER — Ambulatory Visit (AMBULATORY_SURGERY_CENTER): Payer: BC Managed Care – PPO | Admitting: Internal Medicine

## 2021-03-29 VITALS — BP 100/70 | HR 71 | Temp 98.4°F | Resp 12 | Ht 68.0 in | Wt 154.0 lb

## 2021-03-29 DIAGNOSIS — K635 Polyp of colon: Secondary | ICD-10-CM | POA: Diagnosis not present

## 2021-03-29 DIAGNOSIS — Z1211 Encounter for screening for malignant neoplasm of colon: Secondary | ICD-10-CM

## 2021-03-29 DIAGNOSIS — D120 Benign neoplasm of cecum: Secondary | ICD-10-CM

## 2021-03-29 DIAGNOSIS — D122 Benign neoplasm of ascending colon: Secondary | ICD-10-CM

## 2021-03-29 MED ORDER — SODIUM CHLORIDE 0.9 % IV SOLN: 500.0000 mL | INTRAVENOUS | Status: DC

## 2021-03-29 NOTE — Progress Notes (Signed)
GASTROENTEROLOGY PROCEDURE H&P NOTE   Primary Care Physician: Ginger Organ., MD    Reason for Procedure:   Colon cancer screening  Plan:    Colonoscopy  Patient is appropriate for endoscopic procedure(s) in the ambulatory ([place]) setting.  The nature of the procedure, as well as the risks, benefits, and alternatives were carefully and thoroughly reviewed with the patient. Ample time for discussion and questions allowed. The patient understood, was satisfied, and agreed to proceed.     HPI: Deborah [name] is a 51 y.o. female who presents for colonoscopy for colon cancer screening. Denies blood in stools, changes in bowel habits, weight loss. Denies fam hx of colon cancer.  Past Medical History:  Diagnosis Date   Raynaud's disease    bilateral feet and hands   Varicose veins     Past Surgical History:  Procedure Laterality Date   ANTERIOR CRUCIATE LIGAMENT REPAIR Right 2020   BREAST LUMPECTOMY  1996   left breast   ENDOVENOUS ABLATION SAPHENOUS VEIN W/ LASER  03/29/2008   right greater saphenous vein, stab phlebectomy right leg, sclerotherapy    PATELLAR TENDON REPAIR  1996   left knee   stab phlebectomy Right 08/26/2012   10-20 incisions by Curt Jews MD    Prior to Admission medications   Medication Sig Start Date End Date Taking? Authorizing Provider  Cholecalciferol (VITAMIN D) 2000 UNITS tablet Take 5,000 Units by mouth daily.    Yes [provider]  levonorgestrel (MIRENA, 52 MG,) 20 MCG/DAY IUD  05/22/09  Yes [provider]  Magnesium 125 MG CAPS See admin instructions.   Yes [provider]  Multiple Vitamin (MULTIVITAMIN) tablet Take 1 tablet by mouth daily.   Yes [provider]  Probiotic Product (PROBIOTIC-10 PO) Take 100 mcg by mouth.   Yes [provider]  Tart Cherry [place]   Yes [provider]  TURMERIC PO Take by mouth.   Yes [provider]  vitamin B-12  (CYANOCOBALAMIN) 100 MCG tablet Take 10 mcg by mouth daily.   Yes [provider]  VITAMIN K PO Take by mouth.   Yes [provider]  Diclofenac Sodium (PENNSAID) 2 % SOLN Place 2 g onto the skin 2 (two) times daily. Patient not taking: Reported on 03/15/2021 05/20/17   Lyndal Pulley, DO  meloxicam (MOBIC) 15 MG tablet Take 1 tablet (15 mg total) by mouth daily. Patient not taking: Reported on 03/15/2021 07/05/19   Lyndal Pulley, DO  Vitamin D, Ergocalciferol, (DRISDOL) 50000 units CAPS capsule TAKE 1 CAPSULE (50,000 UNITS TOTAL) BY MOUTH EVERY 7 (SEVEN) DAYS. Patient not taking: Reported on 03/15/2021 08/11/17   Lyndal Pulley, DO    Current Outpatient Medications  Medication Sig Dispense Refill   Cholecalciferol (VITAMIN D) 2000 UNITS tablet Take 5,000 Units by mouth daily.      levonorgestrel (MIRENA, 52 MG,) 20 MCG/DAY IUD      Magnesium 125 MG CAPS See admin instructions.     Multiple Vitamin (MULTIVITAMIN) tablet Take 1 tablet by mouth daily.     Probiotic Product (PROBIOTIC-10 PO) Take 100 mcg by mouth.     Tart Cherry 1200 MG CAPS Tart Cherry     TURMERIC PO Take by mouth.     vitamin B-12 (CYANOCOBALAMIN) 100 MCG tablet Take 10 mcg by mouth daily.     VITAMIN K PO Take by mouth.     Diclofenac Sodium (PENNSAID) 2 % SOLN  Place 2 g onto the skin 2 (two) times daily. (Patient not taking: Reported on 03/15/2021) 112 g 3   meloxicam (MOBIC) 15 MG tablet Take 1 tablet (15 mg total) by mouth daily. (Patient not taking: Reported on 03/15/2021) 30 tablet 0   Vitamin D, Ergocalciferol, (DRISDOL) 50000 units CAPS capsule TAKE 1 CAPSULE (50,000 UNITS TOTAL) BY MOUTH EVERY 7 (SEVEN) DAYS. (Patient not taking: Reported on 03/15/2021) 12 capsule 0   Current Facility-Administered Medications  Medication Dose Route Frequency Provider Last Rate Last Admin   0.9 %  sodium chloride infusion  500 mL Intravenous Continuous Sharyn Creamer, MD        Allergies as of 03/29/2021 - Review  Complete 03/29/2021  Allergen Reaction Noted   Sulfa antibiotics  11/18/2011    Family History  Problem Relation Age of Onset   Breast cancer Mother 3   Breast cancer Maternal Aunt    Breast cancer Maternal Grandmother    Colon cancer Neg Hx    Colon polyps Neg Hx    Esophageal cancer Neg Hx    Stomach cancer Neg Hx    Rectal cancer Neg Hx     Social History   Socioeconomic History   Marital status: Married    Spouse name: Not on file   Number of children: Not on file   Years of education: Not on file   Highest education level: Not on file  Occupational History   Not on file  Tobacco Use   Smoking status: Never   Smokeless tobacco: Never  Substance and Sexual Activity   Alcohol use: Yes    Comment: social   Drug use: No   Sexual activity: Not on file  Other Topics Concern   Not on file  Social History Narrative   Not on file   Social Determinants of Health   Financial Resource Strain: Not on file  Food Insecurity: Not on file  Transportation Needs: Not on file  Physical Activity: Not on file  Stress: Not on file  Social Connections: Not on file  Intimate Partner Violence: Not on file    Physical Exam: Vital signs in last 24 hours: BP 103/73    Pulse 76    Temp 98.4 F (36.9 C) (Temporal)    Ht 5\' 8"  (1.727 m)    Wt 154 lb (69.9 kg)    SpO2 100%    BMI 23.42 kg/m  GEN: NAD EYE: Sclerae anicteric ENT: MMM CV: Non-tachycardic Pulm: No increased work of breathing GI: Soft, NT/ND NEURO:  Alert & Oriented   Christia Reading, MD [facility]  03/29/2021 9:53 AM

## 2021-03-29 NOTE — Progress Notes (Signed)
Pt's states no medical or surgical changes since previsit or office visit. 

## 2021-03-29 NOTE — Op Note (Signed)
[place] Patient Name: Deborah [name] Procedure Date: 03/29/2021 9:53 AM MRN: [id number] Endoscopist: Sonny Masters "Deborah [name] ,  Age: 51 Referring MD:  Date of Birth: [birth year] Gender: Female Account #: [id number] Procedure:                Colonoscopy Indications:              Screening for colorectal malignant neoplasm, This                            is the patient's first colonoscopy Medicines:                Monitored Anesthesia Care Procedure:                Pre-Anesthesia Assessment:                           - Prior to the procedure, a History and Physical                            was performed, and patient medications and                            allergies were reviewed. The patient's tolerance of                            previous anesthesia was also reviewed. The risks                            and benefits of the procedure and the sedation                            options and risks were discussed with the patient.                            All questions were answered, and informed consent                            was obtained. Prior Anticoagulants: The patient has                            taken no previous anticoagulant or antiplatelet                            agents. ASA Grade Assessment: I - A normal, healthy                            patient. After reviewing the risks and benefits,                            the patient was deemed in satisfactory condition to                            undergo the procedure.  After obtaining informed consent, the colonoscope                            was passed under direct vision. Throughout the                            procedure, the patient's blood pressure, pulse, and                            oxygen saturations were monitored continuously. The                            Olympus CF-HQ190L [id number]) Colonoscope was                            introduced through the anus and advanced  to the the                            terminal ileum. The colonoscopy was performed                            without difficulty. The patient tolerated the                            procedure well. The quality of the bowel                            preparation was excellent. The terminal ileum,                            ileocecal valve, appendiceal orifice, and rectum                            were photographed. Scope In: 9:57:29 AM Scope Out: 10:19:48 AM Scope Withdrawal Time: 0 hours 13 minutes 55 seconds  Total Procedure Duration: 0 hours 22 minutes 19 seconds  Findings:                 The terminal ileum appeared normal.                           Two sessile polyps were found in the ascending                            colon and cecum. The polyps were 3 to 4 mm in size.                            These polyps were removed with a cold snare.                            Resection and retrieval were complete.                           Non-bleeding internal hemorrhoids were found during  retroflexion. Complications:            No immediate complications. Estimated Blood Loss:     Estimated blood loss was minimal. Impression:               - The examined portion of the ileum was normal.                           - Two 3 to 4 mm polyps in the ascending colon and                            in the cecum, removed with a cold snare. Resected                            and retrieved.                           - Non-bleeding internal hemorrhoids. Recommendation:           - Discharge patient to home (with escort).                           - Await pathology results.                           - The findings and recommendations were discussed                            with the patient. Sonny Masters "Deborah [name],  03/29/2021 10:23:46 AM

## 2021-03-29 NOTE — Progress Notes (Signed)
To PACU, VSS. Report to [email or web address] 

## 2021-03-29 NOTE — Progress Notes (Signed)
Called to room to assist during endoscopic procedure.  Patient ID and intended procedure confirmed with present staff. Received instructions for my participation in the procedure from the performing physician.  

## 2021-03-29 NOTE — Patient Instructions (Signed)
Resume previous diet and medications. °Awaiting pathology results. Repeat Colonoscopy date to be determined based on pathology results. ° °YOU HAD AN ENDOSCOPIC PROCEDURE TODAY AT THE [facility]:   Refer to the procedure report that was given to you for any specific questions about what was found during the examination.  If the procedure report does not answer your questions, please call your gastroenterologist to clarify.  If you requested that your care partner not be given the details of your procedure findings, then the procedure report has been included in a sealed envelope for you to review at your convenience later. ° °YOU SHOULD EXPECT: Some feelings of bloating in the abdomen. Passage of more gas than usual.  Walking can help get rid of the air that was put into your GI tract during the procedure and reduce the bloating. If you had a lower endoscopy (such as a colonoscopy or flexible sigmoidoscopy) you may notice spotting of blood in your stool or on the toilet paper. If you underwent a bowel prep for your procedure, you may not have a normal bowel movement for a few days. ° °Please Note:  You might notice some irritation and congestion in your nose or some drainage.  This is from the oxygen used during your procedure.  There is no need for concern and it should clear up in a day or so. ° °SYMPTOMS TO REPORT IMMEDIATELY: ° °Following lower endoscopy (colonoscopy or flexible sigmoidoscopy): ° Excessive amounts of blood in the stool ° Significant tenderness or worsening of abdominal pains ° Swelling of the abdomen that is new, acute ° Fever of 100°F or higher ° °For urgent or emergent issues, a gastroenterologist can be reached at any hour by calling [phone]. °Do not use MyChart messaging for urgent concerns.  ° ° °DIET:  We do recommend a small meal at first, but then you may proceed to your regular diet.  Drink plenty of fluids but you should avoid alcoholic beverages for 24  hours. ° °ACTIVITY:  You should plan to take it easy for the rest of today and you should NOT DRIVE or use heavy machinery until tomorrow (because of the sedation medicines used during the test).   ° °FOLLOW UP: °Our staff will call the number listed on your records 48-72 hours following your procedure to check on you and address any questions or concerns that you may have regarding the information given to you following your procedure. If we do not reach you, we will leave a message.  We will attempt to reach you two times.  During this call, we will ask if you have developed any symptoms of COVID 19. If you develop any symptoms (ie: fever, flu-like symptoms, shortness of breath, cough etc.) before then, please call [phone].  If you test positive for Covid 19 in the 2 weeks post procedure, please call and report this information to us.   ° °If any biopsies were taken you will be contacted by phone or by letter within the next 1-3 weeks.  Please call us at [phone] if you have not heard about the biopsies in 3 weeks.  ° ° °SIGNATURES/CONFIDENTIALITY: °You and/or your care partner have signed paperwork which will be entered into your electronic medical record.  These signatures attest to the fact that that the information above on your After Visit Summary has been reviewed and is understood.  Full responsibility of the confidentiality of this discharge information lies with you and/or your care-partner.  °

## 2021-04-02 ENCOUNTER — Telehealth: Payer: Self-pay

## 2021-04-02 NOTE — Telephone Encounter (Signed)
°  Follow up Call-  Call back number 03/29/2021  Post procedure Call Back phone  # [phone]  Permission to leave phone message Yes  Some recent data might be hidden     Patient questions:  Do you have a fever, pain , or abdominal swelling? No. Pain Score  0 *  Have you tolerated food without any problems? Yes.    Have you been able to return to your normal activities? Yes.    Do you have any questions about your discharge instructions: Diet   No. Medications  No. Follow up visit  No.  Do you have questions or concerns about your Care? No.  Actions: * If pain score is 4 or above: No action needed, pain <4.

## 2021-04-03 ENCOUNTER — Encounter: Payer: Self-pay | Admitting: Internal Medicine

## 2021-04-03 DIAGNOSIS — M25562 Pain in left knee: Secondary | ICD-10-CM | POA: Diagnosis not present

## 2021-04-03 DIAGNOSIS — M25561 Pain in right knee: Secondary | ICD-10-CM | POA: Diagnosis not present

## 2021-04-03 DIAGNOSIS — R269 Unspecified abnormalities of gait and mobility: Secondary | ICD-10-CM | POA: Diagnosis not present

## 2021-04-10 DIAGNOSIS — F4321 Adjustment disorder with depressed mood: Secondary | ICD-10-CM | POA: Diagnosis not present

## 2021-04-11 DIAGNOSIS — M25561 Pain in right knee: Secondary | ICD-10-CM | POA: Diagnosis not present

## 2021-04-11 DIAGNOSIS — M25562 Pain in left knee: Secondary | ICD-10-CM | POA: Diagnosis not present

## 2021-04-11 DIAGNOSIS — R269 Unspecified abnormalities of gait and mobility: Secondary | ICD-10-CM | POA: Diagnosis not present

## 2021-04-18 DIAGNOSIS — R269 Unspecified abnormalities of gait and mobility: Secondary | ICD-10-CM | POA: Diagnosis not present

## 2021-04-18 DIAGNOSIS — F4321 Adjustment disorder with depressed mood: Secondary | ICD-10-CM | POA: Diagnosis not present

## 2021-04-18 DIAGNOSIS — M25562 Pain in left knee: Secondary | ICD-10-CM | POA: Diagnosis not present

## 2021-04-18 DIAGNOSIS — M25561 Pain in right knee: Secondary | ICD-10-CM | POA: Diagnosis not present

## 2021-04-24 DIAGNOSIS — M25562 Pain in left knee: Secondary | ICD-10-CM | POA: Diagnosis not present

## 2021-04-24 DIAGNOSIS — F4321 Adjustment disorder with depressed mood: Secondary | ICD-10-CM | POA: Diagnosis not present

## 2021-04-24 DIAGNOSIS — R269 Unspecified abnormalities of gait and mobility: Secondary | ICD-10-CM | POA: Diagnosis not present

## 2021-04-24 DIAGNOSIS — M25561 Pain in right knee: Secondary | ICD-10-CM | POA: Diagnosis not present

## 2021-05-01 DIAGNOSIS — F4321 Adjustment disorder with depressed mood: Secondary | ICD-10-CM | POA: Diagnosis not present

## 2021-05-07 DIAGNOSIS — R269 Unspecified abnormalities of gait and mobility: Secondary | ICD-10-CM | POA: Diagnosis not present

## 2021-05-07 DIAGNOSIS — M25561 Pain in right knee: Secondary | ICD-10-CM | POA: Diagnosis not present

## 2021-05-07 DIAGNOSIS — M25562 Pain in left knee: Secondary | ICD-10-CM | POA: Diagnosis not present

## 2021-05-20 DIAGNOSIS — M25561 Pain in right knee: Secondary | ICD-10-CM | POA: Diagnosis not present

## 2021-05-20 DIAGNOSIS — R269 Unspecified abnormalities of gait and mobility: Secondary | ICD-10-CM | POA: Diagnosis not present

## 2021-05-20 DIAGNOSIS — M25562 Pain in left knee: Secondary | ICD-10-CM | POA: Diagnosis not present

## 2021-05-23 DIAGNOSIS — F4321 Adjustment disorder with depressed mood: Secondary | ICD-10-CM | POA: Diagnosis not present

## 2021-06-05 DIAGNOSIS — M25561 Pain in right knee: Secondary | ICD-10-CM | POA: Diagnosis not present

## 2021-06-05 DIAGNOSIS — R269 Unspecified abnormalities of gait and mobility: Secondary | ICD-10-CM | POA: Diagnosis not present

## 2021-06-05 DIAGNOSIS — M25562 Pain in left knee: Secondary | ICD-10-CM | POA: Diagnosis not present

## 2021-06-06 DIAGNOSIS — F4321 Adjustment disorder with depressed mood: Secondary | ICD-10-CM | POA: Diagnosis not present

## 2021-06-14 DIAGNOSIS — M79672 Pain in left foot: Secondary | ICD-10-CM | POA: Diagnosis not present

## 2021-06-14 DIAGNOSIS — M79671 Pain in right foot: Secondary | ICD-10-CM | POA: Diagnosis not present

## 2021-06-14 DIAGNOSIS — Q798 Other congenital malformations of musculoskeletal system: Secondary | ICD-10-CM | POA: Diagnosis not present

## 2021-06-14 DIAGNOSIS — G5762 Lesion of plantar nerve, left lower limb: Secondary | ICD-10-CM | POA: Diagnosis not present

## 2021-06-19 DIAGNOSIS — F4321 Adjustment disorder with depressed mood: Secondary | ICD-10-CM | POA: Diagnosis not present

## 2021-07-17 DIAGNOSIS — F4321 Adjustment disorder with depressed mood: Secondary | ICD-10-CM | POA: Diagnosis not present

## 2021-07-24 DIAGNOSIS — F4321 Adjustment disorder with depressed mood: Secondary | ICD-10-CM | POA: Diagnosis not present

## 2021-07-25 ENCOUNTER — Other Ambulatory Visit: Payer: Self-pay | Admitting: Obstetrics and Gynecology

## 2021-07-25 DIAGNOSIS — Z1231 Encounter for screening mammogram for malignant neoplasm of breast: Secondary | ICD-10-CM

## 2021-08-07 ENCOUNTER — Ambulatory Visit
Admission: RE | Admit: 2021-08-07 | Discharge: 2021-08-07 | Disposition: A | Discharge status: Home / Self Care | Payer: BC Managed Care – PPO | Source: Ambulatory Visit | Attending: Obstetrics and Gynecology | Admitting: Obstetrics and Gynecology

## 2021-08-07 DIAGNOSIS — F4321 Adjustment disorder with depressed mood: Secondary | ICD-10-CM | POA: Diagnosis not present

## 2021-08-07 DIAGNOSIS — Z1231 Encounter for screening mammogram for malignant neoplasm of breast: Secondary | ICD-10-CM

## 2021-08-08 ENCOUNTER — Other Ambulatory Visit: Payer: Self-pay | Admitting: Obstetrics and Gynecology

## 2021-08-08 DIAGNOSIS — N6320 Unspecified lump in the left breast, unspecified quadrant: Secondary | ICD-10-CM

## 2021-08-28 DIAGNOSIS — F4321 Adjustment disorder with depressed mood: Secondary | ICD-10-CM | POA: Diagnosis not present

## 2021-08-30 ENCOUNTER — Ambulatory Visit
Admission: RE | Admit: 2021-08-30 | Discharge: 2021-08-30 | Disposition: A | Discharge status: Home / Self Care | Payer: BC Managed Care – PPO | Source: Ambulatory Visit | Attending: Obstetrics and Gynecology | Admitting: Obstetrics and Gynecology

## 2021-08-30 DIAGNOSIS — R922 Inconclusive mammogram: Secondary | ICD-10-CM | POA: Diagnosis not present

## 2021-08-30 DIAGNOSIS — N6489 Other specified disorders of breast: Secondary | ICD-10-CM | POA: Diagnosis not present

## 2021-08-30 DIAGNOSIS — Z803 Family history of malignant neoplasm of breast: Secondary | ICD-10-CM | POA: Diagnosis not present

## 2021-08-30 DIAGNOSIS — N6320 Unspecified lump in the left breast, unspecified quadrant: Secondary | ICD-10-CM

## 2021-09-03 DIAGNOSIS — D485 Neoplasm of uncertain behavior of skin: Secondary | ICD-10-CM | POA: Diagnosis not present

## 2021-09-03 DIAGNOSIS — D2271 Melanocytic nevi of right lower limb, including hip: Secondary | ICD-10-CM | POA: Diagnosis not present

## 2021-09-03 DIAGNOSIS — L819 Disorder of pigmentation, unspecified: Secondary | ICD-10-CM | POA: Diagnosis not present

## 2021-09-03 DIAGNOSIS — D225 Melanocytic nevi of trunk: Secondary | ICD-10-CM | POA: Diagnosis not present

## 2021-09-03 DIAGNOSIS — L905 Scar conditions and fibrosis of skin: Secondary | ICD-10-CM | POA: Diagnosis not present

## 2021-09-03 DIAGNOSIS — D2262 Melanocytic nevi of left upper limb, including shoulder: Secondary | ICD-10-CM | POA: Diagnosis not present

## 2021-09-04 DIAGNOSIS — F4321 Adjustment disorder with depressed mood: Secondary | ICD-10-CM | POA: Diagnosis not present

## 2021-10-08 DIAGNOSIS — F4321 Adjustment disorder with depressed mood: Secondary | ICD-10-CM | POA: Diagnosis not present

## 2021-10-15 DIAGNOSIS — F4321 Adjustment disorder with depressed mood: Secondary | ICD-10-CM | POA: Diagnosis not present

## 2021-10-23 DIAGNOSIS — F4321 Adjustment disorder with depressed mood: Secondary | ICD-10-CM | POA: Diagnosis not present

## 2021-10-29 DIAGNOSIS — F4321 Adjustment disorder with depressed mood: Secondary | ICD-10-CM | POA: Diagnosis not present

## 2021-11-11 DIAGNOSIS — L821 Other seborrheic keratosis: Secondary | ICD-10-CM | POA: Diagnosis not present

## 2021-11-11 DIAGNOSIS — D485 Neoplasm of uncertain behavior of skin: Secondary | ICD-10-CM | POA: Diagnosis not present

## 2021-11-12 DIAGNOSIS — F4321 Adjustment disorder with depressed mood: Secondary | ICD-10-CM | POA: Diagnosis not present

## 2021-11-26 DIAGNOSIS — Z01419 Encounter for gynecological examination (general) (routine) without abnormal findings: Secondary | ICD-10-CM | POA: Diagnosis not present

## 2021-11-27 DIAGNOSIS — F4321 Adjustment disorder with depressed mood: Secondary | ICD-10-CM | POA: Diagnosis not present

## 2021-12-24 DIAGNOSIS — F4321 Adjustment disorder with depressed mood: Secondary | ICD-10-CM | POA: Diagnosis not present

## 2021-12-30 DIAGNOSIS — E785 Hyperlipidemia, unspecified: Secondary | ICD-10-CM | POA: Diagnosis not present

## 2022-01-06 DIAGNOSIS — Z1331 Encounter for screening for depression: Secondary | ICD-10-CM | POA: Diagnosis not present

## 2022-01-06 DIAGNOSIS — Z1339 Encounter for screening examination for other mental health and behavioral disorders: Secondary | ICD-10-CM | POA: Diagnosis not present

## 2022-01-06 DIAGNOSIS — Z23 Encounter for immunization: Secondary | ICD-10-CM | POA: Diagnosis not present

## 2022-01-06 DIAGNOSIS — Z0000 Encounter for general adult medical examination without abnormal findings: Secondary | ICD-10-CM | POA: Diagnosis not present

## 2022-01-06 DIAGNOSIS — R82998 Other abnormal findings in urine: Secondary | ICD-10-CM | POA: Diagnosis not present

## 2022-01-06 DIAGNOSIS — I7300 Raynaud's syndrome without gangrene: Secondary | ICD-10-CM | POA: Diagnosis not present

## 2022-01-07 DIAGNOSIS — F4321 Adjustment disorder with depressed mood: Secondary | ICD-10-CM | POA: Diagnosis not present

## 2022-01-15 DIAGNOSIS — H01004 Unspecified blepharitis left upper eyelid: Secondary | ICD-10-CM | POA: Diagnosis not present

## 2022-01-15 DIAGNOSIS — H524 Presbyopia: Secondary | ICD-10-CM | POA: Diagnosis not present

## 2022-01-15 DIAGNOSIS — H01001 Unspecified blepharitis right upper eyelid: Secondary | ICD-10-CM | POA: Diagnosis not present

## 2022-01-21 DIAGNOSIS — F4321 Adjustment disorder with depressed mood: Secondary | ICD-10-CM | POA: Diagnosis not present

## 2022-06-10 DIAGNOSIS — M25551 Pain in right hip: Secondary | ICD-10-CM | POA: Diagnosis not present

## 2022-06-20 DIAGNOSIS — M25551 Pain in right hip: Secondary | ICD-10-CM | POA: Diagnosis not present

## 2022-06-26 DIAGNOSIS — M25551 Pain in right hip: Secondary | ICD-10-CM | POA: Diagnosis not present

## 2022-07-08 DIAGNOSIS — M25551 Pain in right hip: Secondary | ICD-10-CM | POA: Diagnosis not present

## 2022-07-15 ENCOUNTER — Other Ambulatory Visit: Payer: Self-pay

## 2022-07-15 ENCOUNTER — Ambulatory Visit (INDEPENDENT_AMBULATORY_CARE_PROVIDER_SITE_OTHER): Payer: BC Managed Care – PPO | Admitting: Sports Medicine

## 2022-07-15 VITALS — BP 120/60 | Ht 67.0 in | Wt 150.0 lb

## 2022-07-15 DIAGNOSIS — M25551 Pain in right hip: Secondary | ICD-10-CM

## 2022-07-15 NOTE — Progress Notes (Signed)
   Subjective:    Patient ID: Deborah [name], female    DOB: 09/03/1970, 52 y.o.   MRN: [id number]  HPI chief complaint: Right hip pain  Patient is a very pleasant active 52 year old female that comes in today complaining of chronic posterior lateral right hip pain.  She has had problems with the same hip in the past.  In 2021, she saw an orthopedic surgeon who recommended a total hip arthroplasty.  She sought a second opinion at [facility] orth[facility] and they recommended a simple cortisone injection instead.  This was tremendously helpful.  Since that time she has had intermittent flareups which at times can be quite painful.  She enjoys walking daily,HIIT workouts twice a week, and strength training 3 times a week.  She is not sure if any of those specific workouts are contributing to her pain.  She does notice pain during walking.  Also pain with sleeping and when getting up first thing in the morning.  She has been working with Ellamae Sia receiving treatment.  The only thing that seems to help is some hot deep stretch yoga.  She also states that Advil is tremendously helpful but does not want to take this chronically.  In addition to the posterior lateral hip pain, she is also getting some pain in the right side of her lower back.  Past medical history reviewed Medications reviewed Allergies reviewed   Review of Systems As above    Objective:   Physical Exam  Well-developed, well-nourished.  No acute distress.  Right hip: Smooth painless hip range of motion with a negative logroll.  There is tenderness to palpation over the posterior lateral greater trochanter.  Positive Trendelenburg.  Slight hip abductor weakness.  Neurovascularly intact distally.      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  She will start pelvic stabilizer home exercises daily and will avoid HIIT training for the next couple of weeks.  We will tentatively plan on a follow-up phone call in approximately 4 weeks but if she does not notice any initial improvement over the next 2 weeks then she will contact me sooner and we will proceed with cortisone injection at that time.  She may need referral to Dr. Obie Dredge at [facility] for an ultrasound-guided injection.  This note was dictated using Dragon naturally speaking software and may contain errors in syntax, spelling, or content which have not been identified prior to signing this note.

## 2022-07-31 ENCOUNTER — Other Ambulatory Visit: Payer: Self-pay | Admitting: Internal Medicine

## 2022-07-31 DIAGNOSIS — E785 Hyperlipidemia, unspecified: Secondary | ICD-10-CM

## 2022-08-28 ENCOUNTER — Ambulatory Visit
Admission: RE | Admit: 2022-08-28 | Discharge: 2022-08-28 | Disposition: A | Discharge status: Home / Self Care | Payer: No Typology Code available for payment source | Source: Ambulatory Visit | Attending: Internal Medicine | Admitting: Internal Medicine

## 2022-08-28 DIAGNOSIS — E785 Hyperlipidemia, unspecified: Secondary | ICD-10-CM

## 2022-09-04 ENCOUNTER — Ambulatory Visit (INDEPENDENT_AMBULATORY_CARE_PROVIDER_SITE_OTHER): Payer: BC Managed Care – PPO

## 2022-09-04 ENCOUNTER — Encounter: Payer: Self-pay | Admitting: Podiatry

## 2022-09-04 ENCOUNTER — Ambulatory Visit (INDEPENDENT_AMBULATORY_CARE_PROVIDER_SITE_OTHER): Payer: BC Managed Care – PPO | Admitting: Podiatry

## 2022-09-04 DIAGNOSIS — M7752 Other enthesopathy of left foot: Secondary | ICD-10-CM

## 2022-09-04 DIAGNOSIS — M7742 Metatarsalgia, left foot: Secondary | ICD-10-CM | POA: Diagnosis not present

## 2022-09-04 DIAGNOSIS — Q828 Other specified congenital malformations of skin: Secondary | ICD-10-CM | POA: Diagnosis not present

## 2022-09-04 MED ORDER — TRIAMCINOLONE ACETONIDE 10 MG/ML IJ SUSP
10.0000 mg | Freq: Once | INTRAMUSCULAR | Status: AC
  Administered 2022-09-04: 10 mg via INTRA_ARTICULAR

## 2022-09-05 NOTE — Progress Notes (Signed)
Subjective:   Patient ID: Deborah [name], female   DOB: 52 y.o.   MRN: [id number]   HPI Patient states she is developed a lot of pain in the ball of her left foot that is intensified recently and has been present to a degree for 6 months.  Also has a lesion on the outside of the left foot which can be bothersome for her and comes and goes.  Patient does not smoke likes to be active   Review of Systems  All other systems reviewed and are negative.       Objective:  Physical Exam Vitals and nursing note reviewed.  Constitutional:      Appearance: She is well-developed.  Pulmonary:     Effort: Pulmonary effort is normal.  Musculoskeletal:        General: Normal range of motion.  Skin:    General: Skin is warm.  Neurological:     Mental Status: She is alert.     Neurovascular status intact muscle strength found to be adequate range of motion adequate with patient found to have inflammation pain first MPJ lateral side left fluid buildup around the joint with no current elevation of the digit associated with it.  Also was noted to have keratotic lesion lateral side with lucent core painful when pressed fifth metatarsal proximity.  Good digital perfusion well-oriented x 3     Assessment:  Capsulitis of the first MPJ of the left foot most likely along with chronic porokeratotic type lesion left     Plan:  H&P reviewed both conditions and discussed.  At this point I went ahead on the lateral side of the joint I did inject 3 mg Dexasone Kenalog 5 mg Xylocaine and took it across a dorsally and then went ahead did courtesy debridement of the lesion discussed functional hallux limitus and the wearing of rigid bottom shoes and if symptoms persist may require orthotics  X-rays indicate that there is no signs of bony issue associated with condition

## 2022-09-30 ENCOUNTER — Other Ambulatory Visit: Payer: Self-pay | Admitting: Internal Medicine

## 2022-09-30 DIAGNOSIS — Z1231 Encounter for screening mammogram for malignant neoplasm of breast: Secondary | ICD-10-CM

## 2022-10-16 ENCOUNTER — Ambulatory Visit
Admission: RE | Admit: 2022-10-16 | Discharge: 2022-10-16 | Disposition: A | Discharge status: Home / Self Care | Payer: BC Managed Care – PPO | Source: Ambulatory Visit | Attending: Internal Medicine | Admitting: Internal Medicine

## 2022-10-16 DIAGNOSIS — Z1231 Encounter for screening mammogram for malignant neoplasm of breast: Secondary | ICD-10-CM

## 2022-10-17 ENCOUNTER — Other Ambulatory Visit: Payer: Self-pay | Admitting: Internal Medicine

## 2022-10-17 DIAGNOSIS — N6489 Other specified disorders of breast: Secondary | ICD-10-CM

## 2022-10-21 ENCOUNTER — Ambulatory Visit
Admission: RE | Admit: 2022-10-21 | Discharge: 2022-10-21 | Disposition: A | Discharge status: Home / Self Care | Payer: BC Managed Care – PPO | Source: Ambulatory Visit | Attending: Internal Medicine | Admitting: Internal Medicine

## 2022-10-21 DIAGNOSIS — Z803 Family history of malignant neoplasm of breast: Secondary | ICD-10-CM | POA: Diagnosis not present

## 2022-10-21 DIAGNOSIS — N6489 Other specified disorders of breast: Secondary | ICD-10-CM

## 2022-10-21 DIAGNOSIS — N6012 Diffuse cystic mastopathy of left breast: Secondary | ICD-10-CM | POA: Diagnosis not present

## 2022-10-21 DIAGNOSIS — N6011 Diffuse cystic mastopathy of right breast: Secondary | ICD-10-CM | POA: Diagnosis not present

## 2022-10-30 ENCOUNTER — Encounter: Payer: Self-pay | Admitting: Podiatry

## 2022-10-30 ENCOUNTER — Ambulatory Visit (INDEPENDENT_AMBULATORY_CARE_PROVIDER_SITE_OTHER): Payer: BC Managed Care – PPO | Admitting: Podiatry

## 2022-10-30 VITALS — BP 118/67 | HR 80

## 2022-10-30 DIAGNOSIS — M7751 Other enthesopathy of right foot: Secondary | ICD-10-CM | POA: Diagnosis not present

## 2022-10-30 DIAGNOSIS — M205X2 Other deformities of toe(s) (acquired), left foot: Secondary | ICD-10-CM | POA: Diagnosis not present

## 2022-10-30 DIAGNOSIS — M7752 Other enthesopathy of left foot: Secondary | ICD-10-CM

## 2022-10-30 MED ORDER — DICLOFENAC SODIUM 75 MG PO TBEC: 75.0000 mg | DELAYED_RELEASE_TABLET | Freq: Two times a day (BID) | ORAL | 2 refills | Status: AC

## 2022-10-30 NOTE — Progress Notes (Signed)
Subjective:   Patient ID: Deborah [name], female   DOB: 52 y.o.   MRN: [id number]   HPI Patient presents stating still having problems around her big toe joint left stating it still sore and it got better for about 6 to 7 weeks then started back all of a sudden a week to 10 days ago.  States it has been very tender in the joint she has ridges of the bottom of both feet   ROS      Objective:  Physical Exam  Neurovascular status intact inflammation around the first MPJ left fluid buildup around the joint surface with moderate inflammation noted with what probably is functional hallux limitus with the very beginning to structural hallux limitus     Assessment:  Inflammatory capsulitis first MPJ with the possibility for arthritic condition     Plan:  H&P reviewed discussed rigid bottom shoes that are important and I went ahead today and casted for functional orthotics with reverse Morton's extension bilateral.  She is going to [place] we will see her back in at 1 point in the future reasonable that she may require a osteotomy first metatarsal to solve this problem

## 2022-10-31 DIAGNOSIS — M94251 Chondromalacia, right hip: Secondary | ICD-10-CM | POA: Diagnosis not present

## 2022-10-31 DIAGNOSIS — M25551 Pain in right hip: Secondary | ICD-10-CM | POA: Diagnosis not present

## 2022-11-04 ENCOUNTER — Other Ambulatory Visit: Payer: BC Managed Care – PPO

## 2022-11-04 ENCOUNTER — Other Ambulatory Visit: Payer: Self-pay | Admitting: Internal Medicine

## 2022-11-04 ENCOUNTER — Encounter: Payer: Self-pay | Admitting: Internal Medicine

## 2022-11-04 DIAGNOSIS — Z803 Family history of malignant neoplasm of breast: Secondary | ICD-10-CM

## 2022-11-28 DIAGNOSIS — D2261 Melanocytic nevi of right upper limb, including shoulder: Secondary | ICD-10-CM | POA: Diagnosis not present

## 2022-11-28 DIAGNOSIS — L819 Disorder of pigmentation, unspecified: Secondary | ICD-10-CM | POA: Diagnosis not present

## 2022-11-28 DIAGNOSIS — D224 Melanocytic nevi of scalp and neck: Secondary | ICD-10-CM | POA: Diagnosis not present

## 2022-11-28 DIAGNOSIS — L814 Other melanin hyperpigmentation: Secondary | ICD-10-CM | POA: Diagnosis not present

## 2022-12-11 DIAGNOSIS — Z30432 Encounter for removal of intrauterine contraceptive device: Secondary | ICD-10-CM | POA: Diagnosis not present

## 2022-12-11 DIAGNOSIS — N951 Menopausal and female climacteric states: Secondary | ICD-10-CM | POA: Diagnosis not present

## 2022-12-12 ENCOUNTER — Ambulatory Visit: Payer: BC Managed Care – PPO

## 2022-12-12 NOTE — Progress Notes (Signed)
Patient presents today to pick up custom molded foot orthotics, diagnosed with capsulitis left and Hallux limitus by Dr. Charlsie Merles.   Orthotics were dispensed and fit was satisfactory. Reviewed instructions for break-in and wear. Written instructions given to patient.  Patient will follow up as needed.   Addison Bailey Cped, CFo, CFm

## 2022-12-22 DIAGNOSIS — M79661 Pain in right lower leg: Secondary | ICD-10-CM | POA: Diagnosis not present

## 2022-12-22 DIAGNOSIS — M79604 Pain in right leg: Secondary | ICD-10-CM | POA: Diagnosis not present

## 2022-12-22 DIAGNOSIS — M79662 Pain in left lower leg: Secondary | ICD-10-CM | POA: Diagnosis not present

## 2022-12-22 DIAGNOSIS — I872 Venous insufficiency (chronic) (peripheral): Secondary | ICD-10-CM | POA: Diagnosis not present

## 2022-12-22 DIAGNOSIS — I83893 Varicose veins of bilateral lower extremities with other complications: Secondary | ICD-10-CM | POA: Diagnosis not present

## 2023-01-15 DIAGNOSIS — I83891 Varicose veins of right lower extremities with other complications: Secondary | ICD-10-CM | POA: Diagnosis not present

## 2023-01-21 DIAGNOSIS — H01004 Unspecified blepharitis left upper eyelid: Secondary | ICD-10-CM | POA: Diagnosis not present

## 2023-01-21 DIAGNOSIS — H01001 Unspecified blepharitis right upper eyelid: Secondary | ICD-10-CM | POA: Diagnosis not present

## 2023-01-21 DIAGNOSIS — I83891 Varicose veins of right lower extremities with other complications: Secondary | ICD-10-CM | POA: Diagnosis not present

## 2023-01-21 DIAGNOSIS — H5213 Myopia, bilateral: Secondary | ICD-10-CM | POA: Diagnosis not present

## 2023-01-21 DIAGNOSIS — I83892 Varicose veins of left lower extremities with other complications: Secondary | ICD-10-CM | POA: Diagnosis not present

## 2023-01-21 DIAGNOSIS — Z09 Encounter for follow-up examination after completed treatment for conditions other than malignant neoplasm: Secondary | ICD-10-CM | POA: Diagnosis not present

## 2023-01-21 DIAGNOSIS — H52203 Unspecified astigmatism, bilateral: Secondary | ICD-10-CM | POA: Diagnosis not present

## 2023-02-03 DIAGNOSIS — Z09 Encounter for follow-up examination after completed treatment for conditions other than malignant neoplasm: Secondary | ICD-10-CM | POA: Diagnosis not present

## 2023-02-03 DIAGNOSIS — I87392 Chronic venous hypertension (idiopathic) with other complications of left lower extremity: Secondary | ICD-10-CM | POA: Diagnosis not present

## 2023-02-10 DIAGNOSIS — E785 Hyperlipidemia, unspecified: Secondary | ICD-10-CM | POA: Diagnosis not present

## 2023-02-16 DIAGNOSIS — R82998 Other abnormal findings in urine: Secondary | ICD-10-CM | POA: Diagnosis not present

## 2023-02-16 DIAGNOSIS — M25851 Other specified joint disorders, right hip: Secondary | ICD-10-CM | POA: Diagnosis not present

## 2023-02-16 DIAGNOSIS — Z0000 Encounter for general adult medical examination without abnormal findings: Secondary | ICD-10-CM | POA: Diagnosis not present

## 2023-02-16 DIAGNOSIS — Z1339 Encounter for screening examination for other mental health and behavioral disorders: Secondary | ICD-10-CM | POA: Diagnosis not present

## 2023-02-16 DIAGNOSIS — Z1331 Encounter for screening for depression: Secondary | ICD-10-CM | POA: Diagnosis not present

## 2023-02-18 DIAGNOSIS — I83892 Varicose veins of left lower extremities with other complications: Secondary | ICD-10-CM | POA: Diagnosis not present

## 2023-02-20 ENCOUNTER — Encounter: Payer: Self-pay | Admitting: Podiatry

## 2023-02-24 DIAGNOSIS — I83892 Varicose veins of left lower extremities with other complications: Secondary | ICD-10-CM | POA: Diagnosis not present

## 2023-03-04 DIAGNOSIS — Z09 Encounter for follow-up examination after completed treatment for conditions other than malignant neoplasm: Secondary | ICD-10-CM | POA: Diagnosis not present

## 2023-03-04 DIAGNOSIS — I83892 Varicose veins of left lower extremities with other complications: Secondary | ICD-10-CM | POA: Diagnosis not present

## 2023-03-04 DIAGNOSIS — I83891 Varicose veins of right lower extremities with other complications: Secondary | ICD-10-CM | POA: Diagnosis not present

## 2023-03-05 DIAGNOSIS — Z6823 Body mass index (BMI) 23.0-23.9, adult: Secondary | ICD-10-CM | POA: Diagnosis not present

## 2023-03-05 DIAGNOSIS — Z01419 Encounter for gynecological examination (general) (routine) without abnormal findings: Secondary | ICD-10-CM | POA: Diagnosis not present

## 2023-03-06 DIAGNOSIS — Z124 Encounter for screening for malignant neoplasm of cervix: Secondary | ICD-10-CM | POA: Diagnosis not present

## 2023-03-11 DIAGNOSIS — I83892 Varicose veins of left lower extremities with other complications: Secondary | ICD-10-CM | POA: Diagnosis not present

## 2023-03-18 DIAGNOSIS — I83891 Varicose veins of right lower extremities with other complications: Secondary | ICD-10-CM | POA: Diagnosis not present

## 2023-03-18 DIAGNOSIS — Z13820 Encounter for screening for osteoporosis: Secondary | ICD-10-CM | POA: Diagnosis not present

## 2023-04-01 DIAGNOSIS — I83891 Varicose veins of right lower extremities with other complications: Secondary | ICD-10-CM | POA: Diagnosis not present

## 2023-04-22 ENCOUNTER — Encounter: Payer: Self-pay | Admitting: Internal Medicine

## 2023-04-27 ENCOUNTER — Ambulatory Visit
Admission: RE | Admit: 2023-04-27 | Discharge: 2023-04-27 | Disposition: A | Discharge status: Home / Self Care | Payer: BC Managed Care – PPO | Source: Ambulatory Visit | Attending: Internal Medicine | Admitting: Internal Medicine

## 2023-04-27 DIAGNOSIS — Z803 Family history of malignant neoplasm of breast: Secondary | ICD-10-CM

## 2023-04-27 DIAGNOSIS — Z1239 Encounter for other screening for malignant neoplasm of breast: Secondary | ICD-10-CM | POA: Diagnosis not present

## 2023-04-27 MED ORDER — GADOPICLENOL 0.5 MMOL/ML IV SOLN
7.0000 mL | Freq: Once | INTRAVENOUS | Status: AC | PRN
  Administered 2023-04-27: 7 mL via INTRAVENOUS

## 2023-05-13 DIAGNOSIS — I8003 Phlebitis and thrombophlebitis of superficial vessels of lower extremities, bilateral: Secondary | ICD-10-CM | POA: Diagnosis not present

## 2023-06-23 DIAGNOSIS — I87393 Chronic venous hypertension (idiopathic) with other complications of bilateral lower extremity: Secondary | ICD-10-CM | POA: Diagnosis not present

## 2023-06-23 DIAGNOSIS — R600 Localized edema: Secondary | ICD-10-CM | POA: Diagnosis not present

## 2023-06-23 DIAGNOSIS — L299 Pruritus, unspecified: Secondary | ICD-10-CM | POA: Diagnosis not present

## 2023-06-23 DIAGNOSIS — M7989 Other specified soft tissue disorders: Secondary | ICD-10-CM | POA: Diagnosis not present

## 2023-06-23 DIAGNOSIS — R252 Cramp and spasm: Secondary | ICD-10-CM | POA: Diagnosis not present

## 2023-06-23 DIAGNOSIS — I872 Venous insufficiency (chronic) (peripheral): Secondary | ICD-10-CM | POA: Diagnosis not present

## 2023-07-07 DIAGNOSIS — I8001 Phlebitis and thrombophlebitis of superficial vessels of right lower extremity: Secondary | ICD-10-CM | POA: Diagnosis not present

## 2023-07-24 IMAGING — MR MR BREAST BILAT WO/W CM
8 of 12 series · 33 of 48 positions shown · IV contrast (7ml gadavist)
Comparison: Previous exam(s).

CLINICAL DATA: 50-year-old female with a strong family history of
breast cancer. Remote history of benign left breast surgical
excision.

EXAM:
BILATERAL BREAST MRI WITH AND WITHOUT CONTRAST
TECHNIQUE: Multiplanar, multisequence MR images of both breasts were obtained
prior to and following the intravenous administration of 7 ml of
Gadavist.

[Series 3: t2_tirm_tra ipat (a-p) · axial · 3.0mm · 0.70mm/px · 1 of 58 slices shown]
[im 1/58]
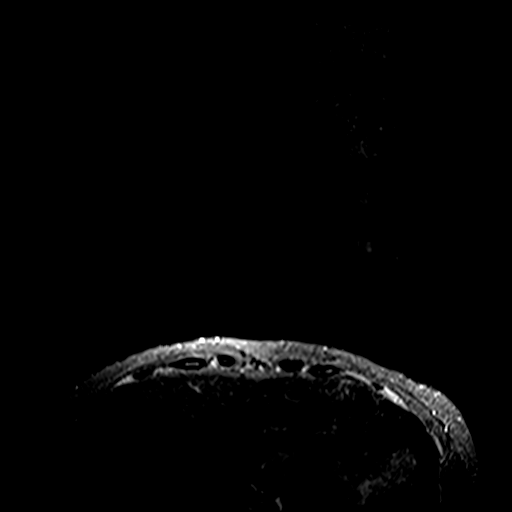

[Series 4: fl3d pre-cm no · axial · non-contrast · 1.2mm · 0.94mm/px · z∈[-93,+79]mm · 5 of 144 slices shown]
[im 1/144]
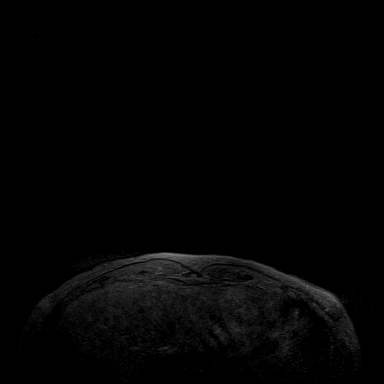
[im 36/144]
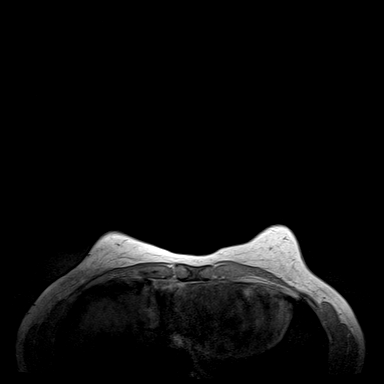
[im 72/144]
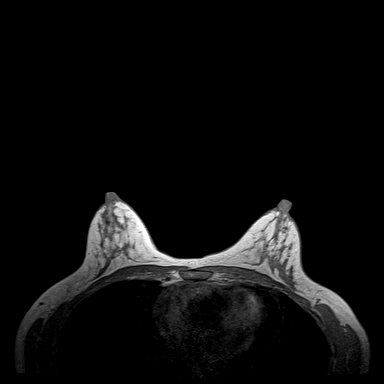
[im 108/144]
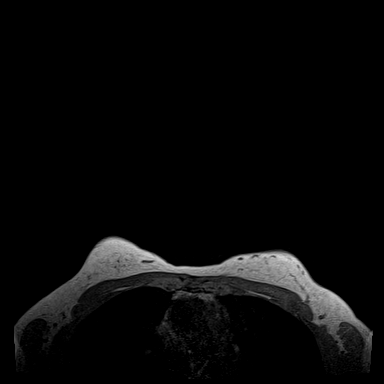
[im 144/144]
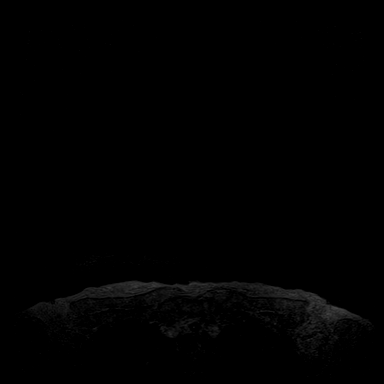

[Series 5: fl3d pre-cm · axial · non-contrast · 1.2mm · 0.94mm/px · z∈[-93,+79]mm · 5 of 144 slices shown]
[im 1/144]
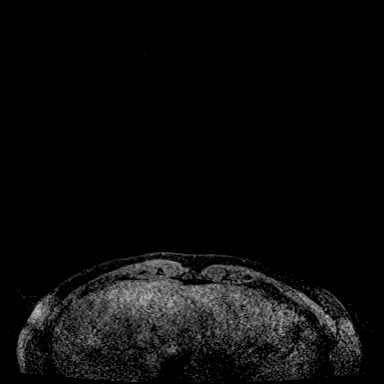
[im 36/144]
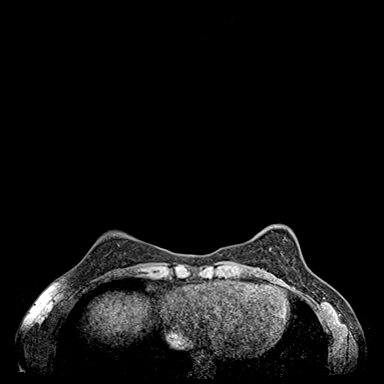
[im 72/144]
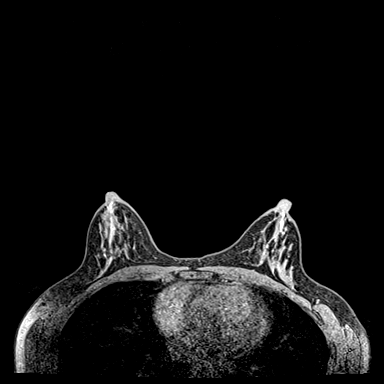
[im 108/144]
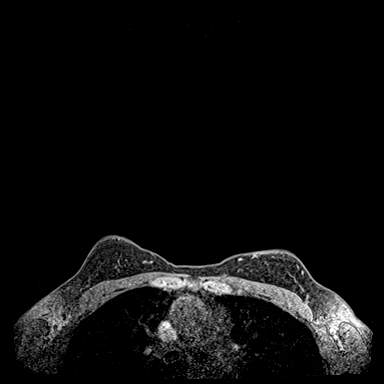
[im 144/144]
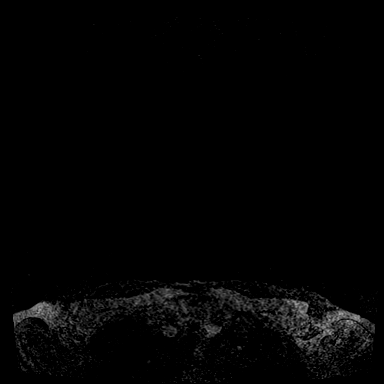

[Series 6: fl3d post-cm 20 · axial · 1.2mm · 0.94mm/px · z∈[-93,+79]mm · 5 of 144 slices shown (1 of 3)]
[im 1/144]
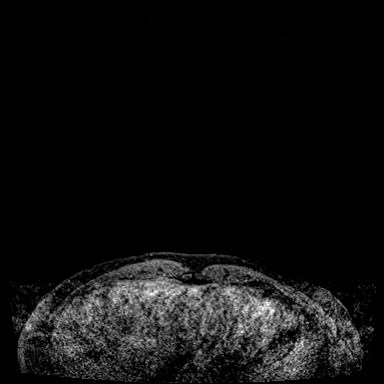
[im 36/144]
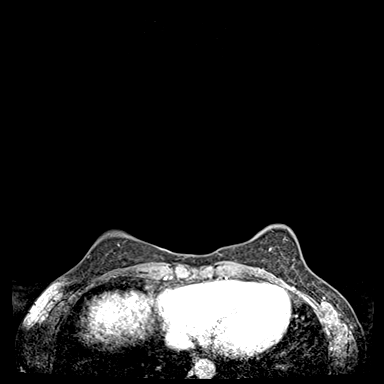
[im 72/144]
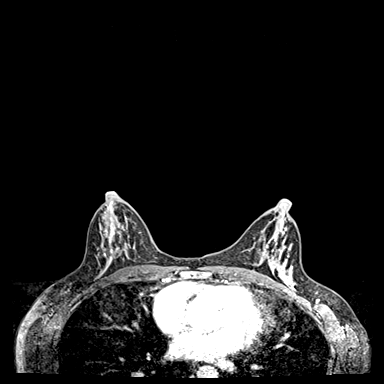
[im 108/144]
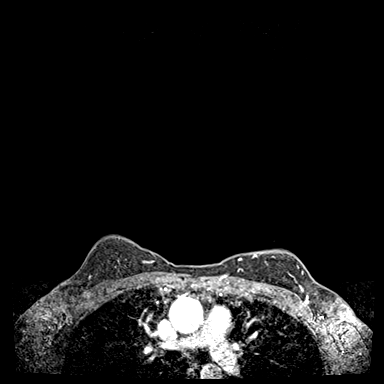
[im 144/144]
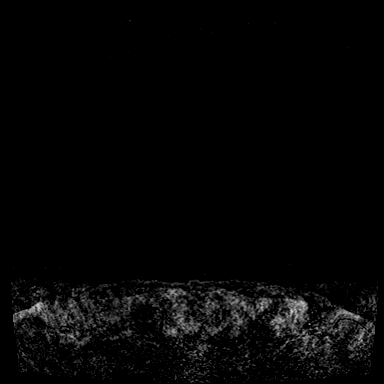

[Series 7: fl3d post-cm 20 · axial · 1.2mm · 0.94mm/px · z∈[-93,+79]mm · 5 of 144 slices shown (2 of 3)]
[im 1/144]
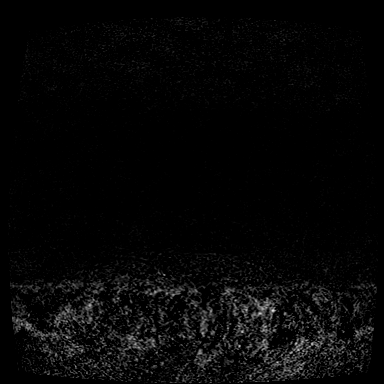
[im 36/144]
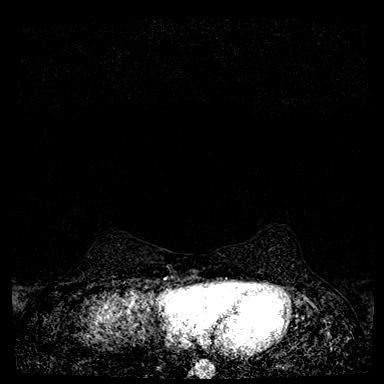
[im 72/144]
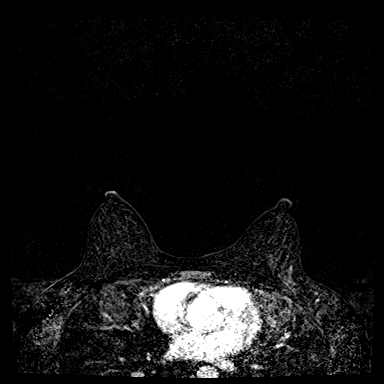
[im 108/144]
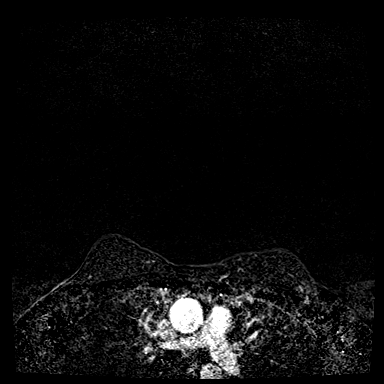
[im 144/144]
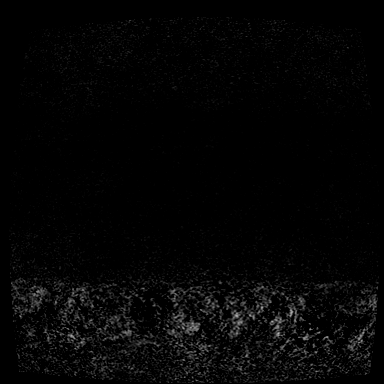

[Series 8: fl3d post-cm 20 · axial · 172.8mm · 0.94mm/px · 1 of 1 slices shown (3 of 3)]
[im 1/1]
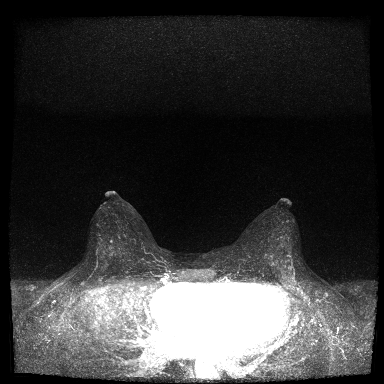

[Series 9: fl3d post-cm 3 · axial · 1.2mm · 0.94mm/px · z∈[-93,+79]mm · 6 of 144 slices shown (1 of 2)]
[im 1/144]
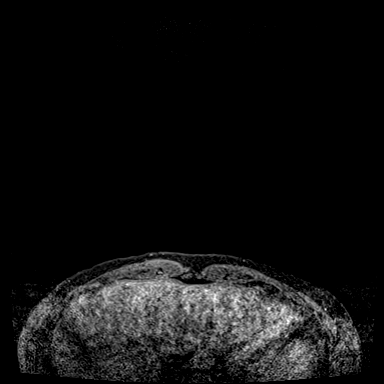
[im 29/144]
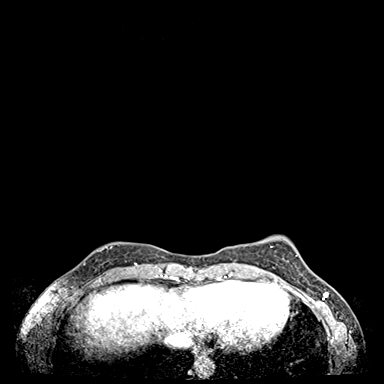
[im 58/144]
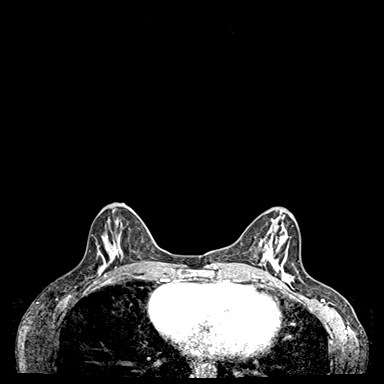
[im 86/144]
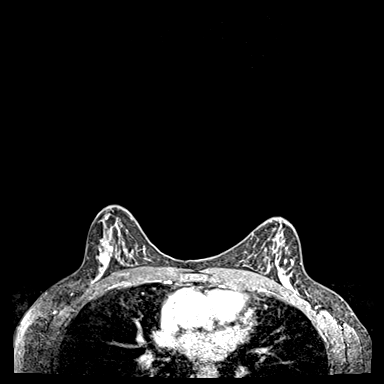
[im 115/144]
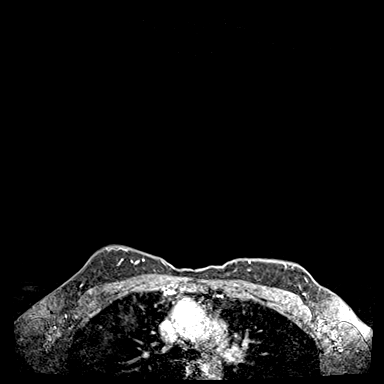
[im 144/144]
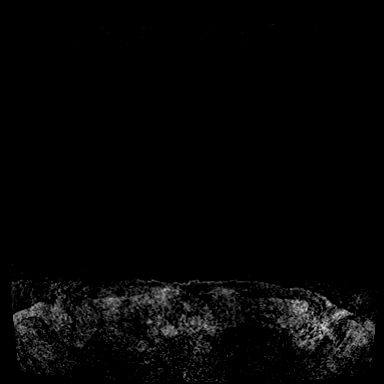

[Series 10: fl3d post-cm 3 · axial · 1.2mm · 0.94mm/px · z∈[-93,+44]mm · 5 of 144 slices shown (2 of 2)]
[im 1/144]
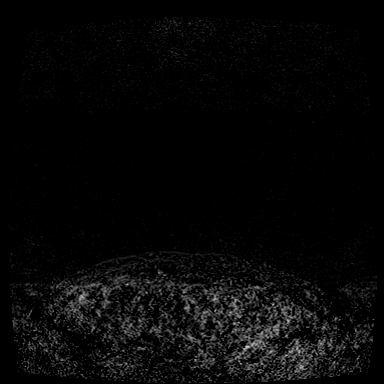
[im 29/144]
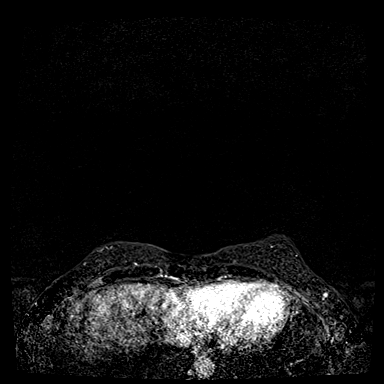
[im 58/144]
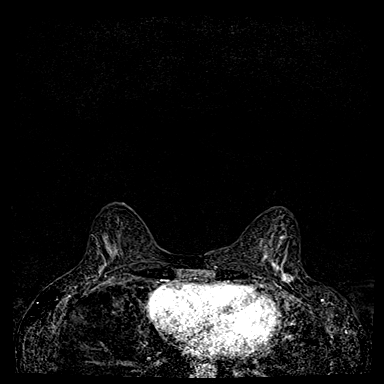
[im 86/144]
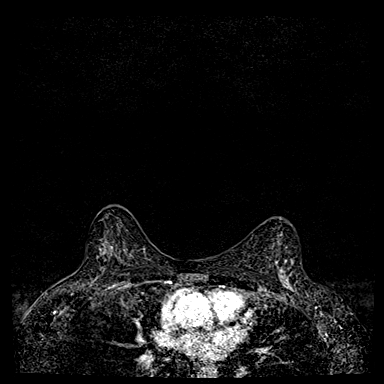
[im 115/144]
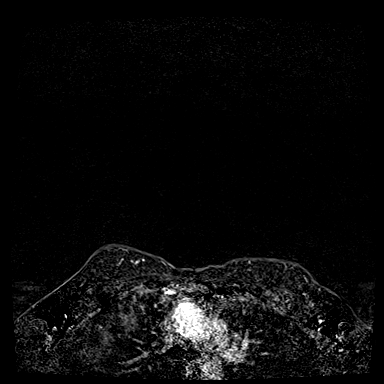

[33 of 48 positions shown; findings below may reference images not displayed]

Three-dimensional MR images were rendered by post-processing of the
original MR data on an independent workstation. The
three-dimensional MR images were interpreted, and findings are
reported in the following complete MRI report for this study. Three
dimensional images were evaluated at the independent interpreting
workstation using the DynaCAD thin client.
FINDINGS: Breast composition: c. Heterogeneous fibroglandular tissue.

Background parenchymal enhancement: Mild to moderate.

Right breast: No suspicious mass or abnormal enhancement.

Left breast: No suspicious mass or abnormal enhancement.

Lymph nodes: No abnormal appearing lymph nodes.

Ancillary findings:  None.
IMPRESSION: No MRI evidence of malignancy.

RECOMMENDATION:
Routine annual screening with mammography and breast MRI. The
patient is due for her next screening mammogram in July 2021.

BI-RADS CATEGORY  1: Negative.

## 2023-10-12 ENCOUNTER — Other Ambulatory Visit: Payer: Self-pay | Admitting: Obstetrics and Gynecology

## 2023-10-12 DIAGNOSIS — Z1231 Encounter for screening mammogram for malignant neoplasm of breast: Secondary | ICD-10-CM

## 2023-10-27 ENCOUNTER — Ambulatory Visit
Admission: RE | Admit: 2023-10-27 | Discharge: 2023-10-27 | Disposition: A | Discharge status: Home / Self Care | Source: Ambulatory Visit | Attending: Obstetrics and Gynecology | Admitting: Obstetrics and Gynecology

## 2023-10-27 DIAGNOSIS — Z1231 Encounter for screening mammogram for malignant neoplasm of breast: Secondary | ICD-10-CM

## 2023-10-29 ENCOUNTER — Other Ambulatory Visit: Payer: Self-pay | Admitting: Obstetrics and Gynecology

## 2023-10-29 DIAGNOSIS — R928 Other abnormal and inconclusive findings on diagnostic imaging of breast: Secondary | ICD-10-CM

## 2023-11-09 ENCOUNTER — Ambulatory Visit
Admission: RE | Admit: 2023-11-09 | Discharge: 2023-11-09 | Disposition: A | Discharge status: Home / Self Care | Source: Ambulatory Visit | Attending: Obstetrics and Gynecology | Admitting: Obstetrics and Gynecology

## 2023-11-09 DIAGNOSIS — R928 Other abnormal and inconclusive findings on diagnostic imaging of breast: Secondary | ICD-10-CM

## 2023-11-10 ENCOUNTER — Other Ambulatory Visit: Payer: Self-pay | Admitting: Internal Medicine

## 2023-11-10 DIAGNOSIS — R921 Mammographic calcification found on diagnostic imaging of breast: Secondary | ICD-10-CM

## 2023-11-11 ENCOUNTER — Other Ambulatory Visit: Payer: Self-pay | Admitting: Internal Medicine

## 2023-11-11 DIAGNOSIS — R921 Mammographic calcification found on diagnostic imaging of breast: Secondary | ICD-10-CM

## 2023-11-12 ENCOUNTER — Ambulatory Visit
Admission: RE | Admit: 2023-11-12 | Discharge: 2023-11-12 | Disposition: A | Discharge status: Home / Self Care | Source: Ambulatory Visit | Attending: Internal Medicine | Admitting: Internal Medicine

## 2023-11-12 DIAGNOSIS — R921 Mammographic calcification found on diagnostic imaging of breast: Secondary | ICD-10-CM

## 2023-11-12 DIAGNOSIS — N6012 Diffuse cystic mastopathy of left breast: Secondary | ICD-10-CM | POA: Diagnosis not present

## 2023-11-12 DIAGNOSIS — R920 Mammographic microcalcification found on diagnostic imaging of breast: Secondary | ICD-10-CM | POA: Diagnosis not present

## 2023-11-12 HISTORY — PX: BREAST BIOPSY: SHX20

## 2023-11-13 LAB — SURGICAL PATHOLOGY

## 2023-12-24 DIAGNOSIS — J040 Acute laryngitis: Secondary | ICD-10-CM | POA: Diagnosis not present

## 2023-12-24 DIAGNOSIS — J029 Acute pharyngitis, unspecified: Secondary | ICD-10-CM | POA: Diagnosis not present

## 2023-12-24 DIAGNOSIS — R051 Acute cough: Secondary | ICD-10-CM | POA: Diagnosis not present

## 2024-01-05 DIAGNOSIS — M7130 Other bursal cyst, unspecified site: Secondary | ICD-10-CM | POA: Diagnosis not present

## 2024-02-25 ENCOUNTER — Other Ambulatory Visit: Payer: Self-pay | Admitting: Internal Medicine

## 2024-02-25 DIAGNOSIS — R921 Mammographic calcification found on diagnostic imaging of breast: Secondary | ICD-10-CM

## 2024-05-16 ENCOUNTER — Encounter
